# Patient Record
Sex: Female | Born: 1950 | Race: White | Hispanic: No | Smoking: Former smoker
Health system: Southern US, Community
[De-identification: ages and names within clinical notes are randomized; demographics above are authoritative.]

## PROBLEM LIST (undated history)

## (undated) DIAGNOSIS — I10 Essential (primary) hypertension: Secondary | ICD-10-CM

## (undated) DIAGNOSIS — E785 Hyperlipidemia, unspecified: Secondary | ICD-10-CM

## (undated) DIAGNOSIS — Z973 Presence of spectacles and contact lenses: Secondary | ICD-10-CM

## (undated) DIAGNOSIS — M1990 Unspecified osteoarthritis, unspecified site: Secondary | ICD-10-CM

## (undated) DIAGNOSIS — J386 Stenosis of larynx: Secondary | ICD-10-CM

## (undated) DIAGNOSIS — T4145XA Adverse effect of unspecified anesthetic, initial encounter: Secondary | ICD-10-CM

## (undated) DIAGNOSIS — K219 Gastro-esophageal reflux disease without esophagitis: Secondary | ICD-10-CM

## (undated) HISTORY — PX: TUBAL LIGATION: SHX77

## (undated) HISTORY — PX: DIRECT LARYNGOSCOPY: SHX5326

---

## 1998-03-05 ENCOUNTER — Other Ambulatory Visit: Admission: RE | Admit: 1998-03-05 | Discharge: 1998-03-05 | Payer: Self-pay | Admitting: Obstetrics and Gynecology

## 1999-03-28 ENCOUNTER — Other Ambulatory Visit: Admission: RE | Admit: 1999-03-28 | Discharge: 1999-03-28 | Payer: Self-pay | Admitting: Obstetrics and Gynecology

## 1999-03-30 ENCOUNTER — Encounter: Payer: Self-pay | Admitting: Family Medicine

## 1999-03-30 ENCOUNTER — Encounter: Admission: RE | Admit: 1999-03-30 | Discharge: 1999-03-30 | Payer: Self-pay | Admitting: General Surgery

## 2000-04-05 ENCOUNTER — Encounter: Payer: Self-pay | Admitting: Family Medicine

## 2000-04-05 ENCOUNTER — Encounter: Admission: RE | Admit: 2000-04-05 | Discharge: 2000-04-05 | Payer: Self-pay | Admitting: Family Medicine

## 2000-08-06 ENCOUNTER — Other Ambulatory Visit: Admission: RE | Admit: 2000-08-06 | Discharge: 2000-08-06 | Payer: Self-pay | Admitting: Obstetrics and Gynecology

## 2001-05-01 ENCOUNTER — Encounter: Admission: RE | Admit: 2001-05-01 | Discharge: 2001-05-01 | Payer: Self-pay | Admitting: Family Medicine

## 2001-05-01 ENCOUNTER — Encounter: Payer: Self-pay | Admitting: Family Medicine

## 2002-02-27 ENCOUNTER — Other Ambulatory Visit: Admission: RE | Admit: 2002-02-27 | Discharge: 2002-02-27 | Payer: Self-pay | Admitting: Obstetrics and Gynecology

## 2002-05-13 ENCOUNTER — Encounter: Payer: Self-pay | Admitting: Family Medicine

## 2002-05-13 ENCOUNTER — Encounter: Admission: RE | Admit: 2002-05-13 | Discharge: 2002-05-13 | Payer: Self-pay | Admitting: Family Medicine

## 2003-04-10 ENCOUNTER — Other Ambulatory Visit: Admission: RE | Admit: 2003-04-10 | Discharge: 2003-04-10 | Payer: Self-pay | Admitting: Obstetrics and Gynecology

## 2003-06-17 ENCOUNTER — Encounter: Admission: RE | Admit: 2003-06-17 | Discharge: 2003-06-17 | Payer: Self-pay | Admitting: Family Medicine

## 2003-11-16 ENCOUNTER — Encounter: Admission: RE | Admit: 2003-11-16 | Discharge: 2003-11-16 | Payer: Self-pay | Admitting: Otolaryngology

## 2004-04-06 ENCOUNTER — Ambulatory Visit (HOSPITAL_COMMUNITY): Admission: RE | Admit: 2004-04-06 | Discharge: 2004-04-07 | Payer: Self-pay | Admitting: Otolaryngology

## 2004-05-09 ENCOUNTER — Encounter: Admission: RE | Admit: 2004-05-09 | Discharge: 2004-05-09 | Payer: Self-pay | Admitting: Otolaryngology

## 2004-05-13 ENCOUNTER — Other Ambulatory Visit: Admission: RE | Admit: 2004-05-13 | Discharge: 2004-05-13 | Payer: Self-pay | Admitting: Obstetrics and Gynecology

## 2004-05-19 ENCOUNTER — Ambulatory Visit (HOSPITAL_COMMUNITY): Admission: RE | Admit: 2004-05-19 | Discharge: 2004-05-19 | Payer: Self-pay | Admitting: Otolaryngology

## 2004-11-04 ENCOUNTER — Ambulatory Visit (HOSPITAL_COMMUNITY): Admission: RE | Admit: 2004-11-04 | Discharge: 2004-11-04 | Payer: Self-pay | Admitting: Otolaryngology

## 2005-02-17 ENCOUNTER — Ambulatory Visit (HOSPITAL_COMMUNITY): Admission: RE | Admit: 2005-02-17 | Discharge: 2005-02-17 | Payer: Self-pay | Admitting: Otolaryngology

## 2005-07-11 ENCOUNTER — Other Ambulatory Visit: Admission: RE | Admit: 2005-07-11 | Discharge: 2005-07-11 | Payer: Self-pay | Admitting: Obstetrics and Gynecology

## 2005-07-18 ENCOUNTER — Encounter: Admission: RE | Admit: 2005-07-18 | Discharge: 2005-07-18 | Payer: Self-pay | Admitting: Obstetrics and Gynecology

## 2006-04-12 ENCOUNTER — Ambulatory Visit (HOSPITAL_BASED_OUTPATIENT_CLINIC_OR_DEPARTMENT_OTHER): Admission: RE | Admit: 2006-04-12 | Discharge: 2006-04-12 | Payer: Self-pay | Admitting: Otolaryngology

## 2007-10-07 ENCOUNTER — Encounter: Admission: RE | Admit: 2007-10-07 | Discharge: 2007-10-07 | Payer: Self-pay | Admitting: Family Medicine

## 2007-10-18 ENCOUNTER — Ambulatory Visit (HOSPITAL_BASED_OUTPATIENT_CLINIC_OR_DEPARTMENT_OTHER): Admission: RE | Admit: 2007-10-18 | Discharge: 2007-10-18 | Payer: Self-pay | Admitting: Otolaryngology

## 2008-12-11 ENCOUNTER — Ambulatory Visit (HOSPITAL_COMMUNITY): Admission: RE | Admit: 2008-12-11 | Discharge: 2008-12-11 | Payer: Self-pay | Admitting: Otolaryngology

## 2008-12-17 ENCOUNTER — Ambulatory Visit (HOSPITAL_BASED_OUTPATIENT_CLINIC_OR_DEPARTMENT_OTHER): Admission: RE | Admit: 2008-12-17 | Discharge: 2008-12-17 | Payer: Self-pay | Admitting: Otolaryngology

## 2009-05-20 ENCOUNTER — Encounter: Admission: RE | Admit: 2009-05-20 | Discharge: 2009-05-20 | Payer: Self-pay | Admitting: Family Medicine

## 2010-04-10 ENCOUNTER — Encounter: Payer: Self-pay | Admitting: Family Medicine

## 2010-04-18 LAB — BASIC METABOLIC PANEL
CO2: 31 mEq/L (ref 19–32)
Calcium: 9.4 mg/dL (ref 8.4–10.5)
Creatinine, Ser: 0.99 mg/dL (ref 0.4–1.2)
GFR calc Af Amer: >60 mL/min (ref >60)
Glucose, Bld: 171 mg/dL — ABNORMAL HIGH (ref 70–99)
Sodium: 139 mEq/L (ref 135–145)

## 2010-04-21 ENCOUNTER — Ambulatory Visit (HOSPITAL_BASED_OUTPATIENT_CLINIC_OR_DEPARTMENT_OTHER)
Admission: RE | Admit: 2010-04-21 | Discharge: 2010-04-21 | Disposition: A | Discharge status: Home / Self Care | Payer: PRIVATE HEALTH INSURANCE | Attending: Otolaryngology | Admitting: Otolaryngology

## 2010-04-21 DIAGNOSIS — J386 Stenosis of larynx: Secondary | ICD-10-CM | POA: Insufficient documentation

## 2010-04-21 DIAGNOSIS — I10 Essential (primary) hypertension: Secondary | ICD-10-CM | POA: Insufficient documentation

## 2010-04-21 DIAGNOSIS — E119 Type 2 diabetes mellitus without complications: Secondary | ICD-10-CM | POA: Insufficient documentation

## 2010-04-21 LAB — GLUCOSE, CAPILLARY: Glucose-Capillary: 153 mg/dL — ABNORMAL HIGH (ref 70–99)

## 2010-05-02 NOTE — Op Note (Signed)
  Victoria Mccormick, Victoria Mccormick             ACCOUNT NO.:  [id number]  MEDICAL RECORD NO.:  [id number]           PATIENT TYPE:  LOCATION:                                 FACILITY:  PHYSICIAN:  Feven Alderfer H. Pollyann Kennedy, MD     DATE OF BIRTH:  03-15-1951  DATE OF PROCEDURE:  04/21/2010 DATE OF DISCHARGE:                              OPERATIVE REPORT   PREOPERATIVE DIAGNOSIS:  Idiopathic subglottic stenosis.  POSTOPERATIVE DIAGNOSIS:  Idiopathic subglottic stenosis.  PROCEDURE:  Microlaryngoscopy with laser treatment of subglottic stenosis and dilatation with balloon dilator up to 16 mm.  No complications.  BLOOD LOSS:  Minimal.  FINDINGS:  Circumferential subglottic stenosis about 1 to 1.5 cm below the glottis with a thin rim of stenosis.  There was no mass or granulation tissue seen.  HISTORY:  A 60 year old lady with idiopathic subglottic stenosis.  Her last treatment was about almost a year and a half ago.  She started having some respiratory obstruction in the past month or two.  Risks, benefits, alternatives, complications of the procedure were explained to the patient who seemed to understand and agreed to surgery.  PROCEDURE:  The patient was taken to the operating room, placed on the operating table in supine position.  Following induction of general endotracheal anesthesia using a #5 endotracheal tube which was passed with some resistance, the table was turned and the patient was draped in the standard fashion including wet eye pads and wet towels around the face.  The anterior commissure laryngoscope was used to visualize the larynx.  The larger laryngoscopes would not provide adequate exposure. A maxillary tooth protector was used.  The scope was suspended to the Mayo stand with suspension apparatus.  The microscope was brought into view.  The endotracheal tube was removed and was easily replaced through the laryngoscope as needed.  The subglottic stenotic area was examined and was  treated with radial cuts using the CO2 laser at 2 W continuous power.  The is stenosis on the right side was minimal and the cuts were all done from 12 o'clock down to 6 o'clock along the left side.  The endotracheal tube was replaced periodically to keep the saturations up in the 90s.  The balloon dilator was then used.  The 16-mm dilator was used on two occasions, each held for 30 seconds.  Following that, the patient was reintubated with a 7 endotracheal tube which passed easily through the subglottic region.  She was then awakened from anesthesia, extubated, and transferred to recovery in stable condition.     Lanie Schelling H. Pollyann Kennedy, MD     JHR/MEDQ  D:  04/21/2010  T:  04/21/2010  Job:  [id number]  Electronically Signed by Serena Colonel MD on 05/02/2010 09:36:01 PM

## 2010-06-09 ENCOUNTER — Other Ambulatory Visit: Payer: Self-pay | Admitting: Family Medicine

## 2010-06-09 DIAGNOSIS — Z1231 Encounter for screening mammogram for malignant neoplasm of breast: Secondary | ICD-10-CM

## 2010-06-17 ENCOUNTER — Ambulatory Visit
Admission: RE | Admit: 2010-06-17 | Discharge: 2010-06-17 | Disposition: A | Discharge status: Home / Self Care | Payer: PRIVATE HEALTH INSURANCE | Source: Ambulatory Visit | Attending: Family Medicine | Admitting: Family Medicine

## 2010-06-17 DIAGNOSIS — Z1231 Encounter for screening mammogram for malignant neoplasm of breast: Secondary | ICD-10-CM

## 2010-06-24 LAB — CBC
HCT: 39 % (ref 36.0–46.0)
MCV: 86.9 fL (ref 78.0–100.0)
RBC: 4.49 MIL/uL (ref 3.87–5.11)
RDW: 12.6 % (ref 11.5–15.5)
WBC: 6.4 10*3/uL (ref 4.0–10.5)

## 2010-06-24 LAB — BASIC METABOLIC PANEL
CO2: 30 mEq/L (ref 19–32)
Calcium: 9.4 mg/dL (ref 8.4–10.5)
GFR calc non Af Amer: >60 mL/min (ref >60)
Glucose, Bld: 149 mg/dL — ABNORMAL HIGH (ref 70–99)

## 2010-06-24 LAB — GLUCOSE, CAPILLARY: Glucose-Capillary: 149 mg/dL — ABNORMAL HIGH (ref 70–99)

## 2010-08-02 NOTE — Op Note (Signed)
NAMEGINNIFER, Victoria Mccormick             ACCOUNT NO.:  [id number]   MEDICAL RECORD NO.:  [id number]          PATIENT TYPE:  AMB   LOCATION:  DSC                          FACILITY:  [facility]   PHYSICIAN:  Jefry H. Pollyann Kennedy, MD     DATE OF BIRTH:  [birth year]   DATE OF PROCEDURE:  10/18/2007  DATE OF DISCHARGE:                               OPERATIVE REPORT   PREOPERATIVE DIAGNOSIS:  Subglottic stenosis.   POSTOPERATIVE DIAGNOSIS:  Subglottic stenosis.   PROCEDURE:  Laser laryngoscopy and dilation of subglottic stenosis.   ANESTHESIA:  General endotracheal anesthesia was used.   COMPLICATIONS:  No complications.   ESTIMATED BLOOD LOSS:  Minimal.   FINDINGS:  Subglottic stenosis, posteriorly-based.  Initially, narrowed  down to 5.5 endotracheal tube size.  At the termination of the  procedure, just short of an 8 endotracheal tube size.   HISTORY:  A 60 year old with idiopathic subglottic stenosis has required  multiple laser laryngoscopies and dilations over the past several years.  The last one was almost 2 years ago.  She has started having inspiratory  stridor again.  Risks, benefits, alternatives, and complications of the  procedure were explained to the patient, seemed understanding, and  agreed to surgery.   PROCEDURE:  The patient was taken to the operating room, placed on the  operating table in supine position.  Following induction of general  endotracheal anesthesia using a 5.5 endotracheal tube, the table was  turned and the patient was draped in standard fashion.  Saline-soaked  towels were used around the face and the eyes.  A Jako-type laryngoscope  was entered into the oral cavity and used to visualize the larynx.  This  was attached to the Mayo stand with suspension apparatus.  Microscope  was brought into view.  Maxillary tooth protector was used throughout  the case.  The endotracheal tube was removed and the larynx and  subglottic area were inspected.  The above-mentioned  findings were  noted.  Using apneic technique, the CO2 laser with 2 watts continuous  power was used to create radial cuts along the subglottic narrowed area  from about 3 o'clock over to 9 o'clock going inferiorly.  The  endotracheal tube was intermittently replaced to allow for ventilation  and to keep the saturations above 95%.  After several cuts were made  with a laser, the [facility] laryngeal dilators were used up to number 34.  I was not able to fit the 38 through the laryngoscope and I was not able  to get it in with correct angle using a anesthesia laryngoscope.  The  patient was then reintubated with a #7 tube and then attempts were made  to reintubate with an 8, I was able to get it  through the cords and up to the stenotic segment, but not quite able to  get it through, was just slight mid to large diameter.  The 7 tube was  placed and the patient was then allowed to awaken from anesthesia, was  extubated, and transferred to recovery in stable condition.      Jefry H. Pollyann Kennedy, MD  Electronically  Signed     JHR/MEDQ  D:  10/18/2007  T:  10/19/2007  Job:  [id number]

## 2010-08-05 NOTE — Op Note (Signed)
Victoria Mccormick, Victoria Mccormick             ACCOUNT NO.:  [id number]   MEDICAL RECORD NO.:  [id number]          PATIENT TYPE:  OIB   LOCATION:  2870                         FACILITY:  [facility]   PHYSICIAN:  Jefry H. Pollyann Kennedy, MD     DATE OF BIRTH:  03/14/1951   DATE OF PROCEDURE:  04/06/2004  DATE OF DISCHARGE:                                 OPERATIVE REPORT   PREOPERATIVE DIAGNOSIS:  Subglottic stenosis.   POSTOPERATIVE DIAGNOSIS:  Subglottic stenosis.   PROCEDURE:  Direct laryngoscopy with bronchoscopy and laser treatment and  dilation of subglottic stenosis.   SURGEON:  Jefrey H. Pollyann Kennedy, MD   ANESTHESIA:  General endotracheal.   COMPLICATIONS:  None.   BLOOD LOSS:  Minimal.   FINDINGS:  Significant subglottic stenosis, less than 1 cm in length,  apparently arising from the anterior wall of the subglottic larynx.  At the  beginning of the procedure, a #4 pediatric endotracheal tube was very  difficult to pass through to the larynx.  After termination, a #6 adult tube  was relatively easily passed through.  The larynx was also dilated up to a  #32 with [facility] dilators.   HISTORY:  This is a 60 year old lady with an 8-10 month history of  progressively worsening dyspnea and inspiratory stridor with exercise  intolerance.  Risks, benefits, alternatives, and complications of the  procedure are explained to the patient, who seemed to understand and agreed  to surgery.   PROCEDURE:  Patient was taken to the operating room and placed on the  operating room table in the supine position.  Following induction of general  endotracheal anesthesia, the airway was evaluated using a combination of a  Jako laryngoscope, anterior commissure laryngoscope, and a rigid  bronchoscope.  Initial attempts at intubation were difficult, but a #4  pediatric tube was passed with minimal difficulty.  The oxygen saturation  levels were within normal range the entire duration of the procedure.  The  larynx was  dilated using Jackson dilators up to a 32 with some difficulty.  Using an anterior commissure scope, the CO2 laser was attached to the  microscope and used at a setting of 3.5 watts with super pulse to laser off  the anterior shelf in a radial fashion.  Further dilations were then  accomplished using various sized endotracheal tubes up to the #6 adult tube.  The bronchoscope was also used to  dilate intermittently during the procedure and also to ventilate and examine  the more distal trachea, which was all normal to inspection.  At the  termination of the procedure, the patient was then awakened, extubated, and  transferred to recovery in stable condition.      JHR/MEDQ  D:  04/06/2004  T:  04/06/2004  Job:  [id number]

## 2010-08-05 NOTE — Op Note (Signed)
NAMEBRE, PECINA             ACCOUNT NO.:  [id number]   MEDICAL RECORD NO.:  [id number]          PATIENT TYPE:  AMB   LOCATION:  SDS                          FACILITY:  [facility]   PHYSICIAN:  Jefry H. Pollyann Kennedy, MD     DATE OF BIRTH:  [birth year]   DATE OF PROCEDURE:  02/17/2005  DATE OF DISCHARGE:  02/17/2005                                 OPERATIVE REPORT   PREOPERATIVE DIAGNOSIS:  Subglottic stenosis with airway obstruction.   POSTOPERATIVE DIAGNOSIS:  Subglottic stenosis with airway obstruction.   PROCEDURE:  Microlaryngoscopy with laser excision of subglottic stenosis and  dilatation of the subglottic larynx up to 38 [place].   SURGEON:  Jefry H. Pollyann Kennedy, MD   ANESTHESIA:  General endotracheal anesthesia was used.   COMPLICATIONS:  None.   BLOOD LOSS:  Minimal.   FINDINGS:  Circumferential but somewhat asymmetric, more so anterior  subglottic stenosis.  At the beginning of the procedure, the patient was  intubated with a 4.5 endotracheal tube and at the termination was intubated  with a #8 endotracheal tube.   HISTORY:  This is a 60 year old lady with idiopathic subglottic stenosis.  It has been about nine months since her last procedure.  The risks,  benefits, alternatives and complications of the procedure were explained to  the patient who seemed to understand and agreed to surgery.   PROCEDURE:  The patient was taken to the operating room and placed on the  operating table in the supine position.  Following induction of general  endotracheal anesthesia, the table was turned.  The patient was draped in  the standard fashion.  A maxillary tooth protector was used for the  procedure.  A Jako laryngoscope was used to examine the larynx and attached  to the Mayo stand with the suspension apparatus.  The microscope was brought  into view.  The endotracheal tube was removed and using apnea technique, the  subglottic airway was inspected.  The CO2 laser attached to the  microscope  was used with a two watt power and continuous beam, on the very small focus  beam, and was used to make radial cuts through the stenosis in all  directions.  Apnea technique was used with reintubation using serially  larger tubes each time in between sessions with the laser.   At the termination of the lasering, the larynx was then dilated with [facility]  dilators up to 38 [place].  The laryngoscope was removed and a #8  endotracheal tube was then inserted with minimal difficulty.  The patient  was then allowed to awaken from anesthesia and was extubated without  difficulty and transferred to recovery in stable condition.      Jefry H. Pollyann Kennedy, MD  Electronically Signed     JHR/MEDQ  D:  02/17/2005  T:  02/18/2005  Job:  [id number]   cc:   Bryan Lemma. Manus Gunning, M.D.  Fax: [phone]

## 2010-08-05 NOTE — Op Note (Signed)
[name], Victoria Mccormick             ACCOUNT NO.:  [id number]   MEDICAL RECORD NO.:  [id number]          PATIENT TYPE:  AMB   LOCATION:  SDS                          FACILITY:  [facility]   PHYSICIAN:  Jefry H. 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   POSTOPERATIVE DIAGNOSIS:  Idiopathic subglottic stenosis presumably  secondary to occult laryngopharyngeal reflux disease.   PROCEDURE:  Microlaryngoscopy with laser excision of subglottic stenosis and  dilatation.   NOTE:  This is the third in a series of procedures.  The patient was  initially intubated with a #5.5 endotracheal tube, was dilated up to 38-  [place], and then a 7.5 endotracheal tube was placed at the end of the  procedure.   REFERRING PHYSICIAN:  Bryan Lemma. Manus Gunning, M.D.   HISTORY:  A 60 year old lady with a history of progressive dyspnea on  exertion and inspiratory stridor, was found to have subglottic stenosis,  idiopathic in nature, and has undergone two procedures, the first in  January, the second in March of this year.  She was starting to have  recurrent stridor with moderate exertional dyspnea.  Risks, benefits,  alternatives, complications of the procedure were explained to the patient,  who seemed to understand and agreed to surgery.   PROCEDURE:  The patient was taken to the operating room and placed on the  operating table in supine position.  Following induction of general endotracheal anesthesia, the table was turned  and the patient was draped in the standard fashion.  An anterior commissure  laser laryngoscope was inserted into the oral cavity with a maxillary tooth  protector in place.  The laryngoscope was attached to the suspension  apparatus on the Mayo stand.  The microscope  was brought into view and the  larynx was examined.  The supraglottic and glottic structures were all  unremarkable.  The subglottic stenosis was visualized in a circumferential  fashion, seemed to be preponderantly on the posterior and right side.  The  CO2 laser was used on a setting of 5 watts continuous, and radial cuts were  made through the stenotic area in all directions.  A total of approximately  eight cuts were made.  The laser and dilatation was done under apneic  technique, removing the endotracheal tube and replacing in between  treatments.  The  stenosis was then dilated with [facility] dilators up to 38-French.  Following  this, #7.5 endotracheal tube was placed without difficulty and left in place  while anesthesia and manage the awakening and emergence from anesthesia.  The patient was then awakened, extubated and transferred to recovery in  stable condition.      Jefry H. Pollyann Kennedy, MD  Electronically Signed     JHR/MEDQ  D:  11/04/2004  T:  11/04/2004  Job:  [id number]   cc:   Bryan Lemma. Manus Gunning, M.D.  [address]  [place] [address]  Fax: [phone]

## 2010-08-05 NOTE — Op Note (Signed)
Victoria Mccormick, Victoria Mccormick             ACCOUNT NO.:  [id number]   MEDICAL RECORD NO.:  [id number]          PATIENT TYPE:  AMB   LOCATION:  DSC                          FACILITY:  [facility]   PHYSICIAN:  Jefry H. Pollyann Kennedy, MD     DATE OF BIRTH:  1951/02/22   DATE OF PROCEDURE:  04/12/2006  DATE OF DISCHARGE:  04/12/2006                               OPERATIVE REPORT   PREOPERATIVE DIAGNOSIS:  Subglottic stenosis.   POSTOPERATIVE DIAGNOSIS:  Subglottic stenosis.   PROCEDURE:  Direct microlaryngoscopy with laser excision of subglottic  stenosis and dilation the stenotic region.   The lesion was dilated with [facility] dilators up to 38-French and the  patient was initially intubated with a 5.5 endotracheal tube.  At the  termination of the procedure, a #8 tube was attempted, but could not  enter completely, although we were able to ventilate through it and a #7  tube was then used at the termination of the procedure, which fit in  fairly easily.  A 7.5 tube was not available for use at this particular  time.   SURGEON:  Jefry H. Pollyann Kennedy, MD   COMPLICATIONS:  No complications.   BLOOD LOSS:  No blood loss.   HISTORY:  A 60 year old with a history of idiopathic subglottic stenosis  who started having exercise intolerance and worsening inspiratory  stridor again.  Risks, benefits, alternatives and complications to the  procedure were explained to the patient, who seemed to understand and  agreed to surgery.   PROCEDURE:  The patient was taken to the operating room and placed on  the operating table in a supine position.  Following induction of  general endotracheal anesthesia with a 5.5 endotracheal tube, the table  was turned and the patient was draped for laser laryngoscopy.  Wet  towels were used around the face.  A Jako-type anterior commissure scope  was used to visualize the larynx.  This was attached to the Mayo stand  with the suspension apparatus.  Once the airway was nicely  visualized,  the endotracheal tube was removed and then replaced through the  laryngoscope.  Tooth protectors were used on upper and lower dentition  throughout the procedure.  The laser work was done using apneic  technique.  The tube was removed and the CO2 laser on a setting of 2  watts continuous power was used to make radial cuts through the  subglottic stenotic region, which was a thin shelf-like circumferential  ring of tissue.  It was concentrated more in the anterior and lateral  and very little in the posterior.  The ventilation tube was replaced  periodically to keep the saturations above 90%.  The patient did  desaturate fairly quickly when the tube was removed, so several sessions  were required in order to keep the saturations up to a safe level.  After the laser cuts were made, the [facility] dilators were used up to 29-  [place].  Following that, the patient was then reintubated through the  laryngoscope and saturations were brought up again to the high 90s.  The  laryngoscope was then removed and  then using an anesthesia laryngoscope,  the patient was reintubated, initially attempted with a #8, which  was not able to pass completely through the subglottis, but we were able  to ventilate easily through that.  A #7 tube was then replaced.  The  patient was then awakened from anesthesia, extubated and transferred to  Recovery in stable condition.      Jefry H. Pollyann Kennedy, MD  Electronically Signed     JHR/MEDQ  D:  04/17/2006  T:  04/17/2006  Job:  [id number]

## 2010-08-05 NOTE — Op Note (Signed)
NAMEJENYA, Victoria Mccormick             ACCOUNT NO.:  [id number]   MEDICAL RECORD NO.:  [id number]          PATIENT TYPE:  OIB   LOCATION:  2857                         FACILITY:  [facility]   PHYSICIAN:  Jefry H. Pollyann Kennedy, MD     DATE OF BIRTH:  02/10/1951   DATE OF PROCEDURE:  05/19/2004  DATE OF DISCHARGE:                                 OPERATIVE REPORT   PREOPERATIVE DIAGNOSIS:  Subglottic stenosis with stridor.   POSTOPERATIVE DIAGNOSIS:  Subglottic stenosis with stridor.   PROCEDURE:  Laser laryngoscopy with dilation of subglottic stenosis up to 42  [place].   SURGEON:  Jefry H. Pollyann Kennedy, M.D.   General endotracheal anesthesia was used.  No complications.   FINDINGS:  Subglottic stenosis, significantly improved compared to the  preoperative state two months ago.  This was dilated up to 42 [place], and  7.5 endotracheal tube was inserted at the termination of the procedure.  Jet ventilation was used during the procedure.  Apneic technique was used  during the dilation and the laser.   HISTORY:  This is a 60 year old lady with a history of idiopathic subglottic  stenosis presumably secondary to chronic reflux disease.  Risks, benefits  alternatives and complications of the procedure were explained to the  patient, who seemed to understand and agreed to surgery.   PROCEDURE:  The patient was taken to the operating room and placed on the  operating table in supine position.  Following induction of general anesthesia, the table was turned and the  patient was draped for laser laryngoscopy.  A maxillary tooth protector was  used.  A Jako laryngoscope was used to view the laryngeal structures.  Jet  ventilation was used on an intermittent basis to maintain oxygen saturation.  The suspension apparatus was used to secure the laryngoscope in place.  The  microscope was brought into view.  The subglottic stenosis was  circumferential.  Radial laser cuts were created using 3 watts continuous  power  in six or seven different positions.  The stenotic segment was then  dilated with [facility] dilators up to 42 [place].  Following this, a number 7.5  endotracheal tube was inserted with some difficulty due to the stenosis.  The patient was then allowed to awaken from anesthesia and was extubated,  transferred to recovery in stable condition.      JHR/MEDQ  D:  05/19/2004  T:  05/19/2004  Job:  [id number]

## 2010-12-16 LAB — POCT HEMOGLOBIN-HEMACUE: Hemoglobin: 14.4

## 2010-12-16 LAB — BASIC METABOLIC PANEL
BUN: 19
Calcium: 9.6
Chloride: 101
Creatinine, Ser: 0.78
GFR calc non Af Amer: >60

## 2011-05-04 ENCOUNTER — Ambulatory Visit: Admit: 2011-05-04 | Payer: Self-pay | Admitting: Otolaryngology

## 2011-05-04 SURGERY — MICROLARYNGOSCOPY
Anesthesia: General

## 2011-05-10 ENCOUNTER — Encounter (HOSPITAL_BASED_OUTPATIENT_CLINIC_OR_DEPARTMENT_OTHER): Payer: Self-pay | Admitting: *Deleted

## 2011-05-10 NOTE — Progress Notes (Signed)
To come in for ekg and bmet Comes yearly for dilations

## 2011-05-10 NOTE — Progress Notes (Signed)
Waved ekg per dr fredereck

## 2011-05-10 NOTE — H&P (Signed)
  Victoria Mccormick is an 61 y.o. female.   Chief Complaint: Shortness of breath HPI: History of chronic, idiopathic subglottic stenosis. Status/post multiple laser treatments and dilations.  No past medical history on file.  No past surgical history on file.  No family history on file. Social History:  does not have a smoking history on file. She does not have any smokeless tobacco history on file. Her alcohol and drug histories not on file.  Allergies: No Known Allergies  No current facility-administered medications on file as of .   No current outpatient prescriptions on file as of .    No results found for this or any previous visit (from the past 48 hour(s)). No results found.  ROS: otherwise negative  There were no vitals taken for this visit.  PHYSICAL EXAM: Overall appearance:  Healthy appearing, in no distress. She has audible inspiratory stridor. Head:  Normocephalic, atraumatic. Ears: External auditory canals are clear; tympanic membranes are intact and the middle ears are free of any effusion. Nose: External nose is healthy in appearance. Internal nasal exam free of any lesions or obstruction. Oral Cavity:  There are no mucosal lesions or masses identified. Oral Pharynx/Hypopharynx/Larynx: no signs of any mucosal lesions or masses identified. Vocal cords move normally. Subglottic airway narrowing. Neuro:  No identifiable neurologic deficits. Neck: No palpable neck masses.  Studies Reviewed: none    Assessment/Plan Idiopathic subglottic stenosis. Repeat laser laryngoscopy with balloon dilation.  Leahmarie Gasiorowski H 05/10/2011, 8:54 AM

## 2011-05-15 ENCOUNTER — Encounter (HOSPITAL_BASED_OUTPATIENT_CLINIC_OR_DEPARTMENT_OTHER)
Admission: RE | Admit: 2011-05-15 | Discharge: 2011-05-15 | Disposition: A | Discharge status: Home / Self Care | Payer: PRIVATE HEALTH INSURANCE | Source: Ambulatory Visit | Attending: Otolaryngology | Admitting: Otolaryngology

## 2011-05-15 LAB — BASIC METABOLIC PANEL
CO2: 31 mEq/L (ref 19–32)
Chloride: 103 mEq/L (ref 96–112)
GFR calc Af Amer: 88 mL/min — ABNORMAL LOW (ref >90)
Potassium: 4.4 mEq/L (ref 3.5–5.1)
Sodium: 143 mEq/L (ref 135–145)

## 2011-05-15 NOTE — Progress Notes (Signed)
Pt informed to bring all medications the day of surgery.  Pt verbalized understanding.

## 2011-05-18 ENCOUNTER — Encounter (HOSPITAL_BASED_OUTPATIENT_CLINIC_OR_DEPARTMENT_OTHER): Payer: Self-pay | Admitting: Anesthesiology

## 2011-05-18 ENCOUNTER — Encounter (HOSPITAL_BASED_OUTPATIENT_CLINIC_OR_DEPARTMENT_OTHER)
Admission: RE | Disposition: A | Discharge status: Home / Self Care | Payer: Self-pay | Source: Ambulatory Visit | Attending: Otolaryngology

## 2011-05-18 ENCOUNTER — Ambulatory Visit (HOSPITAL_BASED_OUTPATIENT_CLINIC_OR_DEPARTMENT_OTHER): Payer: PRIVATE HEALTH INSURANCE | Admitting: Anesthesiology

## 2011-05-18 ENCOUNTER — Encounter (HOSPITAL_BASED_OUTPATIENT_CLINIC_OR_DEPARTMENT_OTHER): Payer: Self-pay | Admitting: *Deleted

## 2011-05-18 ENCOUNTER — Ambulatory Visit (HOSPITAL_BASED_OUTPATIENT_CLINIC_OR_DEPARTMENT_OTHER)
Admission: RE | Admit: 2011-05-18 | Discharge: 2011-05-18 | Disposition: A | Discharge status: Home / Self Care | Payer: PRIVATE HEALTH INSURANCE | Source: Ambulatory Visit | Attending: Otolaryngology | Admitting: Otolaryngology

## 2011-05-18 DIAGNOSIS — E119 Type 2 diabetes mellitus without complications: Secondary | ICD-10-CM | POA: Insufficient documentation

## 2011-05-18 DIAGNOSIS — Z01812 Encounter for preprocedural laboratory examination: Secondary | ICD-10-CM | POA: Insufficient documentation

## 2011-05-18 DIAGNOSIS — J386 Stenosis of larynx: Secondary | ICD-10-CM | POA: Insufficient documentation

## 2011-05-18 DIAGNOSIS — I10 Essential (primary) hypertension: Secondary | ICD-10-CM | POA: Insufficient documentation

## 2011-05-18 DIAGNOSIS — K219 Gastro-esophageal reflux disease without esophagitis: Secondary | ICD-10-CM | POA: Insufficient documentation

## 2011-05-18 HISTORY — DX: Hyperlipidemia, unspecified: E78.5

## 2011-05-18 HISTORY — DX: Essential (primary) hypertension: I10

## 2011-05-18 HISTORY — DX: Gastro-esophageal reflux disease without esophagitis: K21.9

## 2011-05-18 LAB — GLUCOSE, CAPILLARY: Glucose-Capillary: 145 mg/dL — ABNORMAL HIGH (ref 70–99)

## 2011-05-18 LAB — POCT HEMOGLOBIN-HEMACUE: Hemoglobin: 12.5 g/dL (ref 12.0–15.0)

## 2011-05-18 SURGERY — MICROLARYNGOSCOPY WITH LASER AND BALLOON DILATION
Anesthesia: General | Site: Throat | Wound class: Clean Contaminated

## 2011-05-18 MED ORDER — MIDAZOLAM HCL 5 MG/5ML IJ SOLN
INTRAMUSCULAR | Status: DC | PRN
  Administered 2011-05-18: 0.5 mg via INTRAVENOUS

## 2011-05-18 MED ORDER — ONDANSETRON HCL 4 MG/2ML IJ SOLN
INTRAMUSCULAR | Status: DC | PRN
  Administered 2011-05-18: 4 mg via INTRAVENOUS

## 2011-05-18 MED ORDER — AMOXICILLIN 500 MG PO CAPS: 500.0000 mg | ORAL_CAPSULE | Freq: Three times a day (TID) | ORAL | Status: AC

## 2011-05-18 MED ORDER — IBUPROFEN 400 MG PO TABS
400.0000 mg | ORAL_TABLET | Freq: Three times a day (TID) | ORAL | Status: DC
  Administered 2011-05-18: 400 mg via ORAL

## 2011-05-18 MED ORDER — METOCLOPRAMIDE HCL 5 MG/ML IJ SOLN: 10.0000 mg | Freq: Once | INTRAMUSCULAR | Status: DC | PRN

## 2011-05-18 MED ORDER — SUCCINYLCHOLINE CHLORIDE 20 MG/ML IJ SOLN
INTRAMUSCULAR | Status: DC | PRN
  Administered 2011-05-18: 100 mg via INTRAVENOUS

## 2011-05-18 MED ORDER — FENTANYL CITRATE 0.05 MG/ML IJ SOLN: 25.0000 ug | INTRAMUSCULAR | Status: DC | PRN

## 2011-05-18 MED ORDER — PROPOFOL 10 MG/ML IV EMUL
INTRAVENOUS | Status: DC | PRN
  Administered 2011-05-18: 200 mg via INTRAVENOUS

## 2011-05-18 MED ORDER — MORPHINE SULFATE 4 MG/ML IJ SOLN: 0.0500 mg/kg | INTRAMUSCULAR | Status: DC | PRN

## 2011-05-18 MED ORDER — DROPERIDOL 2.5 MG/ML IJ SOLN
INTRAMUSCULAR | Status: DC | PRN
  Administered 2011-05-18: 0.625 mg via INTRAVENOUS

## 2011-05-18 MED ORDER — DEXAMETHASONE SODIUM PHOSPHATE 4 MG/ML IJ SOLN
INTRAMUSCULAR | Status: DC | PRN
  Administered 2011-05-18: 4 mg via INTRAVENOUS

## 2011-05-18 MED ORDER — LACTATED RINGERS IV SOLN
INTRAVENOUS | Status: DC
  Administered 2011-05-18: 07:00:00 via INTRAVENOUS

## 2011-05-18 MED ORDER — LIDOCAINE HCL (CARDIAC) 20 MG/ML IV SOLN
INTRAVENOUS | Status: DC | PRN
  Administered 2011-05-18: 50 mg via INTRAVENOUS

## 2011-05-18 SURGICAL SUPPLY — 31 items
CANISTER SUCTION 1200CC (MISCELLANEOUS) ×2 IMPLANT
CATH BALLOON 10X40MM (CATHETERS) IMPLANT
CLOTH BEACON ORANGE TIMEOUT ST (SAFETY) ×2 IMPLANT
DEPRESSOR TONGUE BLADE STERILE (MISCELLANEOUS) ×2 IMPLANT
DEVICE INFLATION 20/61 (MISCELLANEOUS) ×1 IMPLANT
FILTER 7/8 IN (FILTER) ×1 IMPLANT
GAUZE SPONGE 4X4 12PLY STRL LF (GAUZE/BANDAGES/DRESSINGS) ×4 IMPLANT
GLOVE ECLIPSE 7.5 STRL STRAW (GLOVE) ×2 IMPLANT
GLOVE SKINSENSE NS SZ7.0 (GLOVE) ×1
GLOVE SKINSENSE STRL SZ7.0 (GLOVE) IMPLANT
GOWN PREVENTION PLUS XLARGE (GOWN DISPOSABLE) ×2 IMPLANT
GOWN PREVENTION PLUS XXLARGE (GOWN DISPOSABLE) IMPLANT
GUARD TEETH (MISCELLANEOUS) ×2 IMPLANT
MARKER SKIN DUAL TIP RULER LAB (MISCELLANEOUS) IMPLANT
NS IRRIG 1000ML POUR BTL (IV SOLUTION) ×2 IMPLANT
PAD EYE OVAL STERILE LF (GAUZE/BANDAGES/DRESSINGS) ×4 IMPLANT
PATTIES SURGICAL .5 X3 (DISPOSABLE) ×1 IMPLANT
REDUCTION FITTING 1/4 IN (FILTER) IMPLANT
SHEET MEDIUM DRAPE 40X70 STRL (DRAPES) ×2 IMPLANT
SLEEVE SCD COMPRESS KNEE MED (MISCELLANEOUS) IMPLANT
SOLUTION BUTLER CLEAR DIP (MISCELLANEOUS) IMPLANT
SURGILUBE 2OZ TUBE FLIPTOP (MISCELLANEOUS) IMPLANT
SYR 5ML LL (SYRINGE) IMPLANT
SYR CONTROL 10ML LL (SYRINGE) IMPLANT
SYR TB 1ML LL NO SAFETY (SYRINGE) IMPLANT
SYSTEM BALLN DILATION 12X40 (BALLOONS) IMPLANT
SYSTEM BALLN DILATION 14X40 (BALLOONS) ×1 IMPLANT
SYSTEM BALLN DILATION 16X40 (BALLOONS) IMPLANT
TOWEL OR 17X24 6PK STRL BLUE (TOWEL DISPOSABLE) ×2 IMPLANT
TUBE CONNECTING 20X1/4 (TUBING) ×3 IMPLANT
WATER STERILE IRR 1000ML POUR (IV SOLUTION) ×1 IMPLANT

## 2011-05-18 NOTE — Anesthesia Postprocedure Evaluation (Signed)
Anesthesia Post Note  Patient: Victoria Mccormick  Procedure(s) Performed: Procedure(s) (LRB): MICROLARYNGOSCOPY WITH LASER AND BALLOON DILATION (N/A)  Anesthesia type: General  Patient location: PACU  Post pain: Pain level controlled  Post assessment: Patient's Cardiovascular Status Stable  Last Vitals:  Filed Vitals:   05/18/11 0900  BP: 151/70  Pulse: 61  Temp:   Resp: 12    Post vital signs: Reviewed and stable  Level of consciousness: alert  Complications: No apparent anesthesia complications

## 2011-05-18 NOTE — Transfer of Care (Signed)
Immediate Anesthesia Transfer of Care Note  Patient: Victoria Mccormick  Procedure(s) Performed: Procedure(s) (LRB): MICROLARYNGOSCOPY WITH LASER AND BALLOON DILATION (N/A)  Patient Location: PACU  Anesthesia Type: General  Level of Consciousness: awake, alert  and oriented  Airway & Oxygen Therapy: Patient Spontanous Breathing and Patient connected to face mask oxygen  Post-op Assessment: Report given to PACU RN and Post -op Vital signs reviewed and stable  Post vital signs: Reviewed and stable  Complications: No apparent anesthesia complications

## 2011-05-18 NOTE — Interval H&P Note (Signed)
History and Physical Interval Note:  05/18/2011 7:19 AM  Victoria Mccormick  has presented today for surgery, with the diagnosis of subglottic stenosis  The various methods of treatment have been discussed with the patient and family. After consideration of risks, benefits and other options for treatment, the patient has consented to  Procedure(s) (LRB): MICROLARYNGOSCOPY WITH LASER AND BALLOON DILATION (N/A) as a surgical intervention .  The patients' history has been reviewed, patient examined, no change in status, stable for surgery.  I have reviewed the patients' chart and labs.  Questions were answered to the patient's satisfaction.     [name]

## 2011-05-18 NOTE — Anesthesia Procedure Notes (Signed)
Procedure Name: Intubation Date/Time: 05/18/2011 7:44 AM Performed by: Zenia Resides D Pre-anesthesia Checklist: Patient identified, Emergency Drugs available, Suction available, Patient being monitored and Timeout performed Patient Re-evaluated:Patient Re-evaluated prior to inductionOxygen Delivery Method: Circle System Utilized Preoxygenation: Pre-oxygenation with 100% oxygen Intubation Type: IV induction Ventilation: Mask ventilation without difficulty Laryngoscope Size: Mac and 3 Grade View: Grade II Tube type: Oral Tube size: 5.0 mm Number of attempts: 1 Airway Equipment and Method: stylet and oral airway Placement Confirmation: ETT inserted through vocal cords under direct vision,  positive ETCO2 and breath sounds checked- equal and bilateral Secured at: 23 cm Tube secured with: Tape Dental Injury: Teeth and Oropharynx as per pre-operative assessment

## 2011-05-18 NOTE — Discharge Instructions (Signed)
Resume diet as normal. Take antibiotic as directed by Script         .[facility]  [address] [place], [place] [address] [phone]   Post Anesthesia Home Care Instructions  Activity: Get plenty of rest for the remainder of the day. A responsible adult should stay with you for 24 hours following the procedure.  For the next 24 hours, DO NOT: -Drive a car -Advertising copywriter -Drink alcoholic beverages -Take any medication unless instructed by your physician -Make any legal decisions or sign important papers.  Meals: Start with liquid foods such as gelatin or soup. Progress to regular foods as tolerated. Avoid greasy, spicy, heavy foods. If nausea and/or vomiting occur, drink only clear liquids until the nausea and/or vomiting subsides. Call your physician if vomiting continues.  Special Instructions/Symptoms: Your throat may feel dry or sore from the anesthesia or the breathing tube placed in your throat during surgery. If this causes discomfort, gargle with warm salt water. The discomfort should disappear within 24 hours.

## 2011-05-18 NOTE — Op Note (Signed)
OPERATIVE REPORT  DATE OF SURGERY: 05/18/2011  PATIENT:  Victoria Mccormick,  61 y.o. female  PRE-OPERATIVE DIAGNOSIS:  subglottic stenosis  POST-OPERATIVE DIAGNOSIS:  subglottic stenosis  PROCEDURE:  Procedure(s): MICROLARYNGOSCOPY WITH LASER AND BALLOON DILATION  SURGEON:  Susy Frizzle, MD  ASSISTANTS: none   ANESTHESIA:   general  EBL:  10 ml  DRAINS: none   LOCAL MEDICATIONS USED:  NONE  SPECIMEN:  No Specimen  COUNTS:  YES  PROCEDURE DETAILS: Patient was taken to the operating room and placed on the operating table in the supine position. Following induction of general endotracheal anesthesia, the patient was orally intubated with a #5 tube. There were some resistance felt that it went in easily. The table was then turned 90. Draping was applied around the head and face. Saline soaked eye pads and saline soaked towels were used for on the face for laser protection. The Jako laryngoscope was entered into the oral cavity after placing a maxillary tooth protector. The larynx was visualized. The laryngoscope was attached to the Mayo stand using the suspension apparatus. The endotracheal tube was removed and it was replaced through the laryngoscope. This was done several times throughout the course of the case. The CO2 laser was used on a setting of 2 W continuous power with the microscope attachment and radial cuts were placed around the subglottic stenotic area which was quite thin. A total of 5 cuts were made at about 8:00, 10:00, 12:00, 2 and 4:00. The stenosis was then dilated with the 14 mm balloon up to a pressure of 4 atmosphere and held for 30 seconds x2 times. After this, a #7 endotracheal tube was placed through the laryngoscope without difficulty. After adequate ventilation was confirmed, the laryngoscope was removed. The care of the patient was then turned over to anesthesia for awakening and extubation.   PATIENT DISPOSITION:  PACU - hemodynamically  stable.

## 2011-05-18 NOTE — Anesthesia Preprocedure Evaluation (Addendum)
Anesthesia Evaluation  Patient identified by MRN, date of birth, ID band Patient awake    Reviewed: Allergy & Precautions, H&P , NPO status , Patient's Chart, lab work & pertinent test results, reviewed documented beta blocker date and time   Airway Mallampati: II TM Distance: >3 FB Neck ROM: full    Dental   Pulmonary neg pulmonary ROS,          Cardiovascular hypertension, On Medications and On Home Beta Blockers     Neuro/Psych Negative Neurological ROS  Negative Psych ROS   GI/Hepatic negative GI ROS, Neg liver ROS, GERD-  Medicated and Controlled,  Endo/Other  Diabetes mellitus-, Well Controlled, Type 2, Oral Hypoglycemic Agents  Renal/GU negative Renal ROS  Genitourinary negative   Musculoskeletal   Abdominal   Peds  Hematology negative hematology ROS (+)   Anesthesia Other Findings See surgeon's H&P   Reproductive/Obstetrics negative OB ROS                           Anesthesia Physical Anesthesia Plan  ASA: III  Anesthesia Plan: General   Post-op Pain Management:    Induction: Intravenous  Airway Management Planned: Oral ETT  Additional Equipment:   Intra-op Plan:   Post-operative Plan: Extubation in OR  Informed Consent: I have reviewed the patients History and Physical, chart, labs and discussed the procedure including the risks, benefits and alternatives for the proposed anesthesia with the patient or authorized representative who has indicated his/her understanding and acceptance.     Plan Discussed with: CRNA and Surgeon  Anesthesia Plan Comments:         Anesthesia Quick Evaluation

## 2011-06-29 ENCOUNTER — Other Ambulatory Visit: Payer: Self-pay | Admitting: Family Medicine

## 2011-06-29 DIAGNOSIS — Z1231 Encounter for screening mammogram for malignant neoplasm of breast: Secondary | ICD-10-CM

## 2011-07-21 ENCOUNTER — Ambulatory Visit
Admission: RE | Admit: 2011-07-21 | Discharge: 2011-07-21 | Disposition: A | Discharge status: Home / Self Care | Payer: PRIVATE HEALTH INSURANCE | Source: Ambulatory Visit | Attending: Family Medicine | Admitting: Family Medicine

## 2011-07-21 DIAGNOSIS — Z1231 Encounter for screening mammogram for malignant neoplasm of breast: Secondary | ICD-10-CM

## 2011-09-20 ENCOUNTER — Ambulatory Visit: Payer: BC Managed Care – PPO | Attending: Family Medicine

## 2011-09-20 DIAGNOSIS — R5381 Other malaise: Secondary | ICD-10-CM | POA: Insufficient documentation

## 2011-09-20 DIAGNOSIS — M25519 Pain in unspecified shoulder: Secondary | ICD-10-CM | POA: Insufficient documentation

## 2011-09-20 DIAGNOSIS — M25619 Stiffness of unspecified shoulder, not elsewhere classified: Secondary | ICD-10-CM | POA: Insufficient documentation

## 2011-09-20 DIAGNOSIS — IMO0001 Reserved for inherently not codable concepts without codable children: Secondary | ICD-10-CM | POA: Insufficient documentation

## 2011-09-22 ENCOUNTER — Ambulatory Visit: Payer: BC Managed Care – PPO

## 2011-09-26 ENCOUNTER — Ambulatory Visit: Payer: BC Managed Care – PPO

## 2011-09-28 ENCOUNTER — Ambulatory Visit: Payer: BC Managed Care – PPO | Admitting: Physical Therapy

## 2011-10-02 ENCOUNTER — Ambulatory Visit: Payer: BC Managed Care – PPO | Admitting: Physical Therapy

## 2011-10-11 ENCOUNTER — Ambulatory Visit: Payer: BC Managed Care – PPO | Admitting: Physical Therapy

## 2011-10-23 ENCOUNTER — Ambulatory Visit: Payer: BC Managed Care – PPO | Attending: Family Medicine

## 2011-10-23 DIAGNOSIS — M25519 Pain in unspecified shoulder: Secondary | ICD-10-CM | POA: Insufficient documentation

## 2011-10-23 DIAGNOSIS — R5381 Other malaise: Secondary | ICD-10-CM | POA: Insufficient documentation

## 2011-10-23 DIAGNOSIS — M25619 Stiffness of unspecified shoulder, not elsewhere classified: Secondary | ICD-10-CM | POA: Insufficient documentation

## 2011-10-23 DIAGNOSIS — IMO0001 Reserved for inherently not codable concepts without codable children: Secondary | ICD-10-CM | POA: Insufficient documentation

## 2011-10-25 ENCOUNTER — Ambulatory Visit: Payer: BC Managed Care – PPO

## 2011-11-03 ENCOUNTER — Ambulatory Visit: Payer: BC Managed Care – PPO | Admitting: Physical Therapy

## 2011-11-09 ENCOUNTER — Ambulatory Visit: Payer: BC Managed Care – PPO | Admitting: Physical Therapy

## 2011-11-13 ENCOUNTER — Ambulatory Visit: Payer: BC Managed Care – PPO | Admitting: Physical Therapy

## 2011-11-15 ENCOUNTER — Ambulatory Visit: Payer: BC Managed Care – PPO | Admitting: Physical Therapy

## 2011-11-21 ENCOUNTER — Ambulatory Visit: Payer: BC Managed Care – PPO | Attending: Family Medicine

## 2011-11-21 DIAGNOSIS — R5381 Other malaise: Secondary | ICD-10-CM | POA: Insufficient documentation

## 2011-11-21 DIAGNOSIS — M25619 Stiffness of unspecified shoulder, not elsewhere classified: Secondary | ICD-10-CM | POA: Insufficient documentation

## 2011-11-21 DIAGNOSIS — M25519 Pain in unspecified shoulder: Secondary | ICD-10-CM | POA: Insufficient documentation

## 2011-11-21 DIAGNOSIS — IMO0001 Reserved for inherently not codable concepts without codable children: Secondary | ICD-10-CM | POA: Insufficient documentation

## 2011-11-23 ENCOUNTER — Ambulatory Visit: Payer: BC Managed Care – PPO | Admitting: Physical Therapy

## 2011-11-27 ENCOUNTER — Ambulatory Visit: Payer: BC Managed Care – PPO | Admitting: Physical Therapy

## 2011-11-29 ENCOUNTER — Ambulatory Visit: Payer: BC Managed Care – PPO | Admitting: Physical Therapy

## 2011-12-01 ENCOUNTER — Ambulatory Visit: Payer: BC Managed Care – PPO | Admitting: Physical Therapy

## 2011-12-04 ENCOUNTER — Ambulatory Visit: Payer: BC Managed Care – PPO | Admitting: Physical Therapy

## 2011-12-06 ENCOUNTER — Ambulatory Visit: Payer: BC Managed Care – PPO | Admitting: Physical Therapy

## 2011-12-11 ENCOUNTER — Ambulatory Visit: Payer: BC Managed Care – PPO | Admitting: Physical Therapy

## 2011-12-13 ENCOUNTER — Ambulatory Visit: Payer: BC Managed Care – PPO | Admitting: Physical Therapy

## 2011-12-18 ENCOUNTER — Ambulatory Visit: Payer: BC Managed Care – PPO | Admitting: Physical Therapy

## 2011-12-20 ENCOUNTER — Ambulatory Visit: Payer: BC Managed Care – PPO | Attending: Family Medicine | Admitting: Physical Therapy

## 2011-12-20 ENCOUNTER — Other Ambulatory Visit: Payer: Self-pay | Admitting: Orthopedic Surgery

## 2011-12-20 DIAGNOSIS — M25519 Pain in unspecified shoulder: Secondary | ICD-10-CM | POA: Insufficient documentation

## 2011-12-20 DIAGNOSIS — IMO0001 Reserved for inherently not codable concepts without codable children: Secondary | ICD-10-CM | POA: Insufficient documentation

## 2011-12-20 DIAGNOSIS — R5381 Other malaise: Secondary | ICD-10-CM | POA: Insufficient documentation

## 2011-12-20 DIAGNOSIS — M25512 Pain in left shoulder: Secondary | ICD-10-CM

## 2011-12-20 DIAGNOSIS — M25619 Stiffness of unspecified shoulder, not elsewhere classified: Secondary | ICD-10-CM | POA: Insufficient documentation

## 2011-12-21 ENCOUNTER — Ambulatory Visit
Admission: RE | Admit: 2011-12-21 | Discharge: 2011-12-21 | Disposition: A | Discharge status: Home / Self Care | Payer: BC Managed Care – PPO | Source: Ambulatory Visit | Attending: Orthopedic Surgery | Admitting: Orthopedic Surgery

## 2011-12-21 DIAGNOSIS — M25512 Pain in left shoulder: Secondary | ICD-10-CM

## 2012-07-29 NOTE — Consult Note (Signed)
Victoria Mccormick is an 61 y.o. female.   Chief Complaint: stridor HPI: History of idiopathic subglottic stenosis, requiring periodic laryngoscopy with balloon dilation.  Past Medical History  Diagnosis Date  . Hypertension   . Diabetes mellitus   . GERD (gastroesophageal reflux disease)   . Hyperlipemia     Past Surgical History  Procedure Laterality Date  . Direct laryngoscopy      glottic stenosis dilated-multiple times-2013 #8  . Cesarean section      x2    No family history on file. Social History:  reports that she has never smoked. She does not have any smokeless tobacco history on file. She reports that  drinks alcohol. She reports that she does not use illicit drugs.  Allergies: No Known Allergies  No prescriptions prior to admission    No results found for this or any previous visit (from the past 48 hour(s)). No results found.  ROS: otherwise negative  There were no vitals taken for this visit.  PHYSICAL EXAM: Overall appearance:  Overweight lady, with inspiratory stridor, in no distress Head:  Normocephalic, atraumatic. Ears: External auditory canals are clear; tympanic membranes are intact and the middle ears are free of any effusion. Nose: External nose is healthy in appearance. Internal nasal exam free of any lesions or obstruction. Oral Cavity/pharynx:  There are no mucosal lesions or masses identified. Hypopharynx/Larynx: no signs of any mucosal lesions or masses identified. Vocal cords move normally. Neuro:  No identifiable neurologic deficits. Neck: No palpable neck masses.  Studies Reviewed: none    Assessment/Plan Proceed with microlaryngoscopy, laser and balloon dilation.  Victoria Mccormick 07/29/2012, 12:13 PM    

## 2012-08-06 ENCOUNTER — Encounter (HOSPITAL_BASED_OUTPATIENT_CLINIC_OR_DEPARTMENT_OTHER): Payer: Self-pay | Admitting: *Deleted

## 2012-08-06 NOTE — Progress Notes (Signed)
Husband fell and died from complication 3/14-she has been here multiple times-to come for ekg and bmet- Has lost 40 lb since last yr- Is NOT supposed to stay overnight

## 2012-08-08 ENCOUNTER — Other Ambulatory Visit: Payer: Self-pay

## 2012-08-08 ENCOUNTER — Encounter (HOSPITAL_BASED_OUTPATIENT_CLINIC_OR_DEPARTMENT_OTHER)
Admission: RE | Admit: 2012-08-08 | Discharge: 2012-08-08 | Disposition: A | Discharge status: Home / Self Care | Payer: BC Managed Care – PPO | Source: Ambulatory Visit | Attending: Otolaryngology | Admitting: Otolaryngology

## 2012-08-08 DIAGNOSIS — J398 Other specified diseases of upper respiratory tract: Secondary | ICD-10-CM | POA: Diagnosis not present

## 2012-08-08 DIAGNOSIS — K219 Gastro-esophageal reflux disease without esophagitis: Secondary | ICD-10-CM | POA: Diagnosis not present

## 2012-08-08 DIAGNOSIS — E119 Type 2 diabetes mellitus without complications: Secondary | ICD-10-CM | POA: Diagnosis not present

## 2012-08-08 DIAGNOSIS — Z01812 Encounter for preprocedural laboratory examination: Secondary | ICD-10-CM | POA: Diagnosis not present

## 2012-08-08 DIAGNOSIS — E785 Hyperlipidemia, unspecified: Secondary | ICD-10-CM | POA: Diagnosis not present

## 2012-08-08 DIAGNOSIS — J386 Stenosis of larynx: Secondary | ICD-10-CM | POA: Diagnosis not present

## 2012-08-08 DIAGNOSIS — I10 Essential (primary) hypertension: Secondary | ICD-10-CM | POA: Diagnosis not present

## 2012-08-08 DIAGNOSIS — R061 Stridor: Secondary | ICD-10-CM | POA: Diagnosis present

## 2012-08-08 LAB — BASIC METABOLIC PANEL
BUN: 17 mg/dL (ref 6–23)
Chloride: 101 mEq/L (ref 96–112)
Glucose, Bld: 113 mg/dL — ABNORMAL HIGH (ref 70–99)
Potassium: 4 mEq/L (ref 3.5–5.1)

## 2012-08-15 ENCOUNTER — Encounter (HOSPITAL_BASED_OUTPATIENT_CLINIC_OR_DEPARTMENT_OTHER): Payer: Self-pay

## 2012-08-15 ENCOUNTER — Ambulatory Visit (HOSPITAL_BASED_OUTPATIENT_CLINIC_OR_DEPARTMENT_OTHER): Payer: BC Managed Care – PPO | Admitting: Anesthesiology

## 2012-08-15 ENCOUNTER — Ambulatory Visit (HOSPITAL_BASED_OUTPATIENT_CLINIC_OR_DEPARTMENT_OTHER)
Admission: RE | Admit: 2012-08-15 | Discharge: 2012-08-15 | Disposition: A | Discharge status: Home / Self Care | Payer: BC Managed Care – PPO | Source: Ambulatory Visit | Attending: Otolaryngology | Admitting: Otolaryngology

## 2012-08-15 ENCOUNTER — Encounter (HOSPITAL_BASED_OUTPATIENT_CLINIC_OR_DEPARTMENT_OTHER): Payer: Self-pay | Admitting: Anesthesiology

## 2012-08-15 ENCOUNTER — Encounter (HOSPITAL_BASED_OUTPATIENT_CLINIC_OR_DEPARTMENT_OTHER)
Admission: RE | Disposition: A | Discharge status: Home / Self Care | Payer: Self-pay | Source: Ambulatory Visit | Attending: Otolaryngology

## 2012-08-15 DIAGNOSIS — Z01812 Encounter for preprocedural laboratory examination: Secondary | ICD-10-CM | POA: Insufficient documentation

## 2012-08-15 DIAGNOSIS — J386 Stenosis of larynx: Secondary | ICD-10-CM | POA: Diagnosis not present

## 2012-08-15 DIAGNOSIS — E119 Type 2 diabetes mellitus without complications: Secondary | ICD-10-CM | POA: Insufficient documentation

## 2012-08-15 DIAGNOSIS — I10 Essential (primary) hypertension: Secondary | ICD-10-CM | POA: Insufficient documentation

## 2012-08-15 DIAGNOSIS — J398 Other specified diseases of upper respiratory tract: Secondary | ICD-10-CM | POA: Insufficient documentation

## 2012-08-15 DIAGNOSIS — K219 Gastro-esophageal reflux disease without esophagitis: Secondary | ICD-10-CM | POA: Insufficient documentation

## 2012-08-15 DIAGNOSIS — E663 Overweight: Secondary | ICD-10-CM | POA: Insufficient documentation

## 2012-08-15 DIAGNOSIS — Z6827 Body mass index (BMI) 27.0-27.9, adult: Secondary | ICD-10-CM | POA: Insufficient documentation

## 2012-08-15 DIAGNOSIS — E785 Hyperlipidemia, unspecified: Secondary | ICD-10-CM | POA: Insufficient documentation

## 2012-08-15 HISTORY — PX: MICROLARYNGOSCOPY WITH LASER AND BALLOON DILATION: SHX5973

## 2012-08-15 LAB — POCT HEMOGLOBIN-HEMACUE: Hemoglobin: 13.7 g/dL (ref 12.0–15.0)

## 2012-08-15 LAB — GLUCOSE, CAPILLARY: Glucose-Capillary: 123 mg/dL — ABNORMAL HIGH (ref 70–99)

## 2012-08-15 SURGERY — MICROLARYNGOSCOPY WITH LASER AND BALLOON DILATION
Anesthesia: General | Site: Throat | Wound class: Clean Contaminated

## 2012-08-15 MED ORDER — PROPOFOL 10 MG/ML IV BOLUS
INTRAVENOUS | Status: DC | PRN
  Administered 2012-08-15: 150 mg via INTRAVENOUS
  Administered 2012-08-15: 50 mg via INTRAVENOUS

## 2012-08-15 MED ORDER — LACTATED RINGERS IV SOLN
INTRAVENOUS | Status: DC
  Administered 2012-08-15: 11:00:00 via INTRAVENOUS

## 2012-08-15 MED ORDER — NEOSTIGMINE METHYLSULFATE 1 MG/ML IJ SOLN
INTRAMUSCULAR | Status: DC | PRN
  Administered 2012-08-15: 3 mg via INTRAVENOUS

## 2012-08-15 MED ORDER — MIDAZOLAM HCL 2 MG/2ML IJ SOLN: 1.0000 mg | INTRAMUSCULAR | Status: DC | PRN

## 2012-08-15 MED ORDER — FENTANYL CITRATE 0.05 MG/ML IJ SOLN: 50.0000 ug | INTRAMUSCULAR | Status: DC | PRN

## 2012-08-15 MED ORDER — GLYCOPYRROLATE 0.2 MG/ML IJ SOLN
INTRAMUSCULAR | Status: DC | PRN
  Administered 2012-08-15: 0.4 mg via INTRAVENOUS

## 2012-08-15 MED ORDER — HYDROMORPHONE HCL PF 1 MG/ML IJ SOLN: 0.2500 mg | INTRAMUSCULAR | Status: DC | PRN

## 2012-08-15 MED ORDER — MIDAZOLAM HCL 5 MG/5ML IJ SOLN
INTRAMUSCULAR | Status: DC | PRN
  Administered 2012-08-15: 2 mg via INTRAVENOUS

## 2012-08-15 MED ORDER — CEFAZOLIN SODIUM-DEXTROSE 2-3 GM-% IV SOLR
2.0000 g | INTRAVENOUS | Status: AC
  Administered 2012-08-15: 2 g via INTRAVENOUS

## 2012-08-15 MED ORDER — ONDANSETRON HCL 4 MG/2ML IJ SOLN
INTRAMUSCULAR | Status: DC | PRN
  Administered 2012-08-15: 4 mg via INTRAVENOUS

## 2012-08-15 MED ORDER — LIDOCAINE HCL (CARDIAC) 20 MG/ML IV SOLN
INTRAVENOUS | Status: DC | PRN
  Administered 2012-08-15: 100 mg via INTRAVENOUS

## 2012-08-15 MED ORDER — DEXAMETHASONE SODIUM PHOSPHATE 4 MG/ML IJ SOLN
INTRAMUSCULAR | Status: DC | PRN
  Administered 2012-08-15: 8 mg via INTRAVENOUS

## 2012-08-15 MED ORDER — FENTANYL CITRATE 0.05 MG/ML IJ SOLN
INTRAMUSCULAR | Status: DC | PRN
  Administered 2012-08-15: 100 ug via INTRAVENOUS

## 2012-08-15 MED ORDER — SUCCINYLCHOLINE CHLORIDE 20 MG/ML IJ SOLN
INTRAMUSCULAR | Status: DC | PRN
  Administered 2012-08-15: 100 mg via INTRAVENOUS

## 2012-08-15 MED ORDER — AMOXICILLIN 500 MG PO CAPS: 500.0000 mg | ORAL_CAPSULE | Freq: Three times a day (TID) | ORAL | Status: DC

## 2012-08-15 MED ORDER — ROCURONIUM BROMIDE 100 MG/10ML IV SOLN
INTRAVENOUS | Status: DC | PRN
  Administered 2012-08-15: 20 mg via INTRAVENOUS

## 2012-08-15 SURGICAL SUPPLY — 31 items
BALLN PULM 15 16.5 18X75 (BALLOONS)
BALLN PULMONARY 10-12 (MISCELLANEOUS) ×1 IMPLANT
BALLN PULMONARY 8-10 OD75 (MISCELLANEOUS) IMPLANT
BALLOON PULM 15 16.5 18X75 (BALLOONS) IMPLANT
CANISTER SUCTION 1200CC (MISCELLANEOUS) ×2 IMPLANT
CLOTH BEACON ORANGE TIMEOUT ST (SAFETY) ×2 IMPLANT
DEPRESSOR TONGUE BLADE STERILE (MISCELLANEOUS) ×2 IMPLANT
FILTER 7/8 IN (FILTER) IMPLANT
GAUZE SPONGE 4X4 12PLY STRL LF (GAUZE/BANDAGES/DRESSINGS) ×4 IMPLANT
GLOVE ECLIPSE 6.5 STRL STRAW (GLOVE) ×1 IMPLANT
GLOVE ECLIPSE 7.5 STRL STRAW (GLOVE) ×2 IMPLANT
GOWN PREVENTION PLUS XLARGE (GOWN DISPOSABLE) IMPLANT
GOWN PREVENTION PLUS XXLARGE (GOWN DISPOSABLE) IMPLANT
GUARD TEETH (MISCELLANEOUS) IMPLANT
MARKER SKIN DUAL TIP RULER LAB (MISCELLANEOUS) IMPLANT
NDL SPNL 22GX7 QUINCKE BK (NEEDLE) IMPLANT
NEEDLE SPNL 22GX7 QUINCKE BK (NEEDLE) IMPLANT
NS IRRIG 1000ML POUR BTL (IV SOLUTION) ×2 IMPLANT
PAD EYE OVAL STERILE LF (GAUZE/BANDAGES/DRESSINGS) ×4 IMPLANT
PATTIES SURGICAL .5 X3 (DISPOSABLE) ×2 IMPLANT
REDUCTION FITTING 1/4 IN (FILTER) IMPLANT
SHEET MEDIUM DRAPE 40X70 STRL (DRAPES) ×2 IMPLANT
SLEEVE SCD COMPRESS KNEE MED (MISCELLANEOUS) ×2 IMPLANT
SOLUTION BUTLER CLEAR DIP (MISCELLANEOUS) IMPLANT
SURGILUBE 2OZ TUBE FLIPTOP (MISCELLANEOUS) IMPLANT
SYR 5ML LL (SYRINGE) ×1 IMPLANT
SYR CONTROL 10ML LL (SYRINGE) ×1 IMPLANT
SYR INFLATE BILIARY GAUGE (MISCELLANEOUS) IMPLANT
SYR TB 1ML LL NO SAFETY (SYRINGE) IMPLANT
TOWEL OR 17X24 6PK STRL BLUE (TOWEL DISPOSABLE) ×1 IMPLANT
TUBE CONNECTING 20X1/4 (TUBING) ×3 IMPLANT

## 2012-08-15 NOTE — Transfer of Care (Signed)
Immediate Anesthesia Transfer of Care Note  Patient: Victoria Mccormick  Procedure(s) Performed: Procedure(s) with comments: MICROLARYNGOSCOPY WITH LASER AND BALLOON DILATION (N/A) - Tracheal Dilation  Patient Location: PACU  Anesthesia Type:General  Level of Consciousness: awake, alert  and oriented  Airway & Oxygen Therapy: Patient Spontanous Breathing and Patient connected to T-piece oxygen  Post-op Assessment: Report given to PACU RN and Post -op Vital signs reviewed and stable  Post vital signs: Reviewed and stable  Complications: No apparent anesthesia complications

## 2012-08-15 NOTE — H&P (View-Only) (Signed)
Victoria Mccormick is an 62 y.o. female.   Chief Complaint: stridor HPI: History of idiopathic subglottic stenosis, requiring periodic laryngoscopy with balloon dilation.  Past Medical History  Diagnosis Date  . Hypertension   . Diabetes mellitus   . GERD (gastroesophageal reflux disease)   . Hyperlipemia     Past Surgical History  Procedure Laterality Date  . Direct laryngoscopy      glottic stenosis dilated-multiple times-2013 #8  . Cesarean section      x2    No family history on file. Social History:  reports that she has never smoked. She does not have any smokeless tobacco history on file. She reports that  drinks alcohol. She reports that she does not use illicit drugs.  Allergies: No Known Allergies  No prescriptions prior to admission    No results found for this or any previous visit (from the past 48 hour(s)). No results found.  ROS: otherwise negative  There were no vitals taken for this visit.  PHYSICAL EXAM: Overall appearance:  Overweight lady, with inspiratory stridor, in no distress Head:  Normocephalic, atraumatic. Ears: External auditory canals are clear; tympanic membranes are intact and the middle ears are free of any effusion. Nose: External nose is healthy in appearance. Internal nasal exam free of any lesions or obstruction. Oral Cavity/pharynx:  There are no mucosal lesions or masses identified. Hypopharynx/Larynx: no signs of any mucosal lesions or masses identified. Vocal cords move normally. Neuro:  No identifiable neurologic deficits. Neck: No palpable neck masses.  Studies Reviewed: none    Assessment/Plan Proceed with microlaryngoscopy, laser and balloon dilation.  [name] 07/29/2012, 12:13 PM

## 2012-08-15 NOTE — Anesthesia Preprocedure Evaluation (Addendum)
Anesthesia Evaluation  Patient identified by MRN, date of birth, ID band Patient awake    Reviewed: Allergy & Precautions, H&P , NPO status , Patient's Chart, lab work & pertinent test results  History of Anesthesia Complications (+) PONV  Airway Mallampati: II      Dental   Pulmonary  breath sounds clear to auscultation        Cardiovascular hypertension, Pt. on medications Rhythm:Regular Rate:Normal     Neuro/Psych    GI/Hepatic Neg liver ROS, GERD-  ,  Endo/Other  diabetes  Renal/GU negative Renal ROS     Musculoskeletal   Abdominal   Peds  Hematology   Anesthesia Other Findings   Reproductive/Obstetrics                          Anesthesia Physical Anesthesia Plan  ASA: III  Anesthesia Plan: General   Post-op Pain Management:    Induction: Intravenous  Airway Management Planned: Oral ETT  Additional Equipment:   Intra-op Plan:   Post-operative Plan: Extubation in OR  Informed Consent: I have reviewed the patients History and Physical, chart, labs and discussed the procedure including the risks, benefits and alternatives for the proposed anesthesia with the patient or authorized representative who has indicated his/her understanding and acceptance.   Dental advisory given  Plan Discussed with: CRNA and Anesthesiologist  Anesthesia Plan Comments:         Anesthesia Quick Evaluation

## 2012-08-15 NOTE — Anesthesia Postprocedure Evaluation (Signed)
  Anesthesia Post-op Note  Patient: Victoria Mccormick  Procedure(s) Performed: Procedure(s) with comments: MICROLARYNGOSCOPY WITH LASER AND BALLOON DILATION (N/A) - Tracheal Dilation  Patient Location: PACU  Anesthesia Type:General  Level of Consciousness: awake, alert  and oriented  Airway and Oxygen Therapy: Patient Spontanous Breathing  Post-op Pain: mild  Post-op Assessment: Post-op Vital signs reviewed, Patient's Cardiovascular Status Stable, Respiratory Function Stable, Patent Airway and No signs of Nausea or vomiting  Post-op Vital Signs: Reviewed and stable  Complications: No apparent anesthesia complications

## 2012-08-15 NOTE — Op Note (Signed)
OPERATIVE REPORT  DATE OF SURGERY: 08/15/2012  PATIENT:  Victoria Mccormick,  62 y.o. female  PRE-OPERATIVE DIAGNOSIS:  tracheal stenosis  POST-OPERATIVE DIAGNOSIS:  tracheal stenosis  PROCEDURE:  Procedure(s): MICROLARYNGOSCOPY WITH LASER AND BALLOON DILATION  SURGEON:  Susy Frizzle, MD  ASSISTANTS: none  ANESTHESIA:   General   EBL:  0 ml  DRAINS: none  LOCAL MEDICATIONS USED:  None  SPECIMEN:  none  COUNTS:  Correct  PROCEDURE DETAILS: The patient was taken to the operating room and placed on the operating table in the supine position. Following induction of general endotracheal anesthesia using a #5 endotracheal tube, the table was turned 90. The maxillary tooth protector was used throughout the case. The patient was draped in the standard fashion. A Jako laryngoscope was entered into the oral cavity and used to evaluate the larynx. This was secured to the Mayo stand using the suspension apparatus. The endotracheal tube was removed and then replaced through the laryngoscope and this was done repeatedly throughout the case. Preoperative microscope was brought into view. The tube was removed and the subglottic region was inspected. A CO2 laser on a setting of 3 W continuous pars used to create radial cuts through the stenotic segment. A CRE balloon dilator was then used, the first time dilated up to 11 mm, and then 2 further times up to 12 mm. Following that, a #7 endotracheal tube was placed without difficulty. The scope was removed. The patient was awakened, extubated and transferred to recovery in stable condition.    PATIENT DISPOSITION:  To PACU, stable

## 2012-08-15 NOTE — Anesthesia Procedure Notes (Addendum)
Procedure Name: Intubation Performed by: York Grice Pre-anesthesia Checklist: Patient identified, Timeout performed, Emergency Drugs available, Suction available and Patient being monitored Patient Re-evaluated:Patient Re-evaluated prior to inductionOxygen Delivery Method: Circle system utilized Preoxygenation: Pre-oxygenation with 100% oxygen Intubation Type: IV induction Ventilation: Mask ventilation without difficulty Laryngoscope Size: Miller and 2 Grade View: Grade II Laser Tube: Cuffed inflated with minimal occlusive pressure - saline Tube size: 4.5 mm Number of attempts: 2 Airway Equipment and Method: Stylet Placement Confirmation: breath sounds checked- equal and bilateral and positive ETCO2 Tube secured with: Tape Dental Injury: Teeth and Oropharynx as per pre-operative assessment    Performed by: York Grice Tube size: 7.0 mm Number of attempts: 1

## 2012-08-15 NOTE — Interval H&P Note (Signed)
History and Physical Interval Note:  08/15/2012 11:28 AM  Victoria Mccormick  has presented today for surgery, with the diagnosis of stenosis  The various methods of treatment have been discussed with the patient and family. After consideration of risks, benefits and other options for treatment, the patient has consented to  Procedure(s): MICROLARYNGOSCOPY WITH LASER AND BALLOON DILATION (N/A) as a surgical intervention .  The patient's history has been reviewed, patient examined, no change in status, stable for surgery.  I have reviewed the patient's chart and labs.  Questions were answered to the patient's satisfaction.     Tawana Pasch

## 2012-08-16 ENCOUNTER — Encounter (HOSPITAL_BASED_OUTPATIENT_CLINIC_OR_DEPARTMENT_OTHER): Payer: Self-pay | Admitting: Otolaryngology

## 2013-01-27 ENCOUNTER — Encounter (HOSPITAL_BASED_OUTPATIENT_CLINIC_OR_DEPARTMENT_OTHER): Payer: Self-pay | Admitting: *Deleted

## 2013-01-27 NOTE — Progress Notes (Signed)
To come in for bmet 

## 2013-01-28 ENCOUNTER — Encounter (HOSPITAL_BASED_OUTPATIENT_CLINIC_OR_DEPARTMENT_OTHER)
Admission: RE | Admit: 2013-01-28 | Discharge: 2013-01-28 | Disposition: A | Discharge status: Home / Self Care | Payer: BC Managed Care – PPO | Source: Ambulatory Visit | Attending: Otolaryngology | Admitting: Otolaryngology

## 2013-01-28 LAB — BASIC METABOLIC PANEL
BUN: 18 mg/dL (ref 6–23)
CO2: 30 mEq/L (ref 19–32)
Calcium: 9.4 mg/dL (ref 8.4–10.5)
Chloride: 99 mEq/L (ref 96–112)
GFR calc non Af Amer: 69 mL/min — ABNORMAL LOW (ref >90)
Glucose, Bld: 177 mg/dL — ABNORMAL HIGH (ref 70–99)
Sodium: 139 mEq/L (ref 135–145)

## 2013-01-30 NOTE — H&P (Signed)
Victoria Mccormick is an 62 y.o. female.   Chief Complaint: Subglottic stenosis HPI: History of idiopathic subglottic stenosis, having stridor and exercise intolerance again recently. Last dilation May 2014.  Past Medical History  Diagnosis Date  . Hypertension   . Diabetes mellitus   . GERD (gastroesophageal reflux disease)   . Hyperlipemia     Past Surgical History  Procedure Laterality Date  . Direct laryngoscopy      glottic stenosis dilated-multiple times-2003to -2013 #8  . Cesarean section      x2  . Microlaryngoscopy with laser and balloon dilation N/A 08/15/2012    Procedure: MICROLARYNGOSCOPY WITH LASER AND BALLOON DILATION;  Surgeon: Victoria Colonel, MD;  Location: [facility];  Service: ENT;  Laterality: N/A;  Tracheal Dilation    History reviewed. No pertinent family history. Social History:  reports that she has never smoked. She does not have any smokeless tobacco history on file. She reports that she drinks alcohol. She reports that she does not use illicit drugs.  Allergies: No Known Allergies  No prescriptions prior to admission    No results found for this or any previous visit (from the past 48 hour(s)). No results found.  ROS: otherwise negative  Height 5' 7.5" (1.715 m), weight 170 lb (77.111 kg).  PHYSICAL EXAM: Overall appearance:  Healthy appearing, in no distress, mild inspiratory stridor. Head:  Normocephalic, atraumatic. Ears: External auditory canals are clear; tympanic membranes are intact and the middle ears are free of any effusion. Nose: External nose is healthy in appearance. Internal nasal exam free of any lesions or obstruction. Oral Cavity/pharynx:  There are no mucosal lesions or masses identified. Neuro:  No identifiable neurologic deficits. Neck: No palpable neck masses.  Studies Reviewed: none    Assessment/Plan Laser laryngoscopy with balloon dilation.  Victoria Mccormick 01/30/2013, 3:50 PM

## 2013-01-31 ENCOUNTER — Encounter (HOSPITAL_BASED_OUTPATIENT_CLINIC_OR_DEPARTMENT_OTHER)
Admission: RE | Disposition: A | Discharge status: Home / Self Care | Payer: Self-pay | Source: Ambulatory Visit | Attending: Otolaryngology

## 2013-01-31 ENCOUNTER — Ambulatory Visit (HOSPITAL_BASED_OUTPATIENT_CLINIC_OR_DEPARTMENT_OTHER)
Admission: RE | Admit: 2013-01-31 | Discharge: 2013-01-31 | Disposition: A | Discharge status: Home / Self Care | Payer: BC Managed Care – PPO | Source: Ambulatory Visit | Attending: Otolaryngology | Admitting: Otolaryngology

## 2013-01-31 ENCOUNTER — Encounter (HOSPITAL_BASED_OUTPATIENT_CLINIC_OR_DEPARTMENT_OTHER): Payer: BC Managed Care – PPO | Admitting: Anesthesiology

## 2013-01-31 ENCOUNTER — Ambulatory Visit (HOSPITAL_BASED_OUTPATIENT_CLINIC_OR_DEPARTMENT_OTHER): Payer: BC Managed Care – PPO | Admitting: Anesthesiology

## 2013-01-31 ENCOUNTER — Encounter (HOSPITAL_BASED_OUTPATIENT_CLINIC_OR_DEPARTMENT_OTHER): Payer: Self-pay | Admitting: Anesthesiology

## 2013-01-31 DIAGNOSIS — E119 Type 2 diabetes mellitus without complications: Secondary | ICD-10-CM | POA: Insufficient documentation

## 2013-01-31 DIAGNOSIS — J398 Other specified diseases of upper respiratory tract: Secondary | ICD-10-CM | POA: Insufficient documentation

## 2013-01-31 DIAGNOSIS — I10 Essential (primary) hypertension: Secondary | ICD-10-CM | POA: Insufficient documentation

## 2013-01-31 DIAGNOSIS — K219 Gastro-esophageal reflux disease without esophagitis: Secondary | ICD-10-CM | POA: Insufficient documentation

## 2013-01-31 DIAGNOSIS — E785 Hyperlipidemia, unspecified: Secondary | ICD-10-CM | POA: Insufficient documentation

## 2013-01-31 DIAGNOSIS — J386 Stenosis of larynx: Secondary | ICD-10-CM | POA: Insufficient documentation

## 2013-01-31 HISTORY — PX: MICROLARYNGOSCOPY WITH LASER AND BALLOON DILATION: SHX5973

## 2013-01-31 LAB — GLUCOSE, CAPILLARY: Glucose-Capillary: 118 mg/dL — ABNORMAL HIGH (ref 70–99)

## 2013-01-31 SURGERY — MICROLARYNGOSCOPY WITH LASER AND BALLOON DILATION
Anesthesia: General | Site: Throat | Wound class: Clean Contaminated

## 2013-01-31 MED ORDER — FENTANYL CITRATE 0.05 MG/ML IJ SOLN: 50.0000 ug | INTRAMUSCULAR | Status: DC | PRN

## 2013-01-31 MED ORDER — FENTANYL CITRATE 0.05 MG/ML IJ SOLN
INTRAMUSCULAR | Status: DC | PRN
  Administered 2013-01-31: 100 ug via INTRAVENOUS

## 2013-01-31 MED ORDER — HYDROMORPHONE HCL PF 1 MG/ML IJ SOLN: 0.2500 mg | INTRAMUSCULAR | Status: DC | PRN

## 2013-01-31 MED ORDER — LIDOCAINE HCL (CARDIAC) 20 MG/ML IV SOLN
INTRAVENOUS | Status: DC | PRN
  Administered 2013-01-31: 80 mg via INTRAVENOUS

## 2013-01-31 MED ORDER — ONDANSETRON HCL 4 MG/2ML IJ SOLN: 4.0000 mg | Freq: Once | INTRAMUSCULAR | Status: DC | PRN

## 2013-01-31 MED ORDER — ONDANSETRON HCL 4 MG/2ML IJ SOLN
INTRAMUSCULAR | Status: DC | PRN
  Administered 2013-01-31: 4 mg via INTRAVENOUS

## 2013-01-31 MED ORDER — ROCURONIUM BROMIDE 100 MG/10ML IV SOLN
INTRAVENOUS | Status: DC | PRN
  Administered 2013-01-31: 30 mg via INTRAVENOUS

## 2013-01-31 MED ORDER — DEXAMETHASONE SODIUM PHOSPHATE 4 MG/ML IJ SOLN
INTRAMUSCULAR | Status: DC | PRN
  Administered 2013-01-31: 5 mg via INTRAVENOUS

## 2013-01-31 MED ORDER — LACTATED RINGERS IV SOLN
INTRAVENOUS | Status: DC
  Administered 2013-01-31 (×2): via INTRAVENOUS

## 2013-01-31 MED ORDER — ROCURONIUM BROMIDE 50 MG/5ML IV SOLN
INTRAVENOUS | Status: AC
  Filled 2013-01-31: qty 1

## 2013-01-31 MED ORDER — OXYCODONE HCL 5 MG/5ML PO SOLN: 5.0000 mg | Freq: Once | ORAL | Status: DC | PRN

## 2013-01-31 MED ORDER — MIDAZOLAM HCL 5 MG/5ML IJ SOLN
INTRAMUSCULAR | Status: DC | PRN
  Administered 2013-01-31: 2 mg via INTRAVENOUS

## 2013-01-31 MED ORDER — PROPOFOL 10 MG/ML IV BOLUS
INTRAVENOUS | Status: DC | PRN
  Administered 2013-01-31: 200 mg via INTRAVENOUS

## 2013-01-31 MED ORDER — NEOSTIGMINE METHYLSULFATE 1 MG/ML IJ SOLN
INTRAMUSCULAR | Status: DC | PRN
  Administered 2013-01-31: 3 mg via INTRAVENOUS

## 2013-01-31 MED ORDER — MIDAZOLAM HCL 2 MG/2ML IJ SOLN: 1.0000 mg | INTRAMUSCULAR | Status: DC | PRN

## 2013-01-31 MED ORDER — FENTANYL CITRATE 0.05 MG/ML IJ SOLN
INTRAMUSCULAR | Status: AC
  Filled 2013-01-31: qty 4

## 2013-01-31 MED ORDER — EPINEPHRINE HCL 1 MG/ML IJ SOLN
INTRAMUSCULAR | Status: AC
  Filled 2013-01-31: qty 1

## 2013-01-31 MED ORDER — OXYCODONE HCL 5 MG PO TABS: 5.0000 mg | ORAL_TABLET | Freq: Once | ORAL | Status: DC | PRN

## 2013-01-31 MED ORDER — GLYCOPYRROLATE 0.2 MG/ML IJ SOLN
INTRAMUSCULAR | Status: DC | PRN
  Administered 2013-01-31: .4 mg via INTRAVENOUS

## 2013-01-31 MED ORDER — MIDAZOLAM HCL 2 MG/2ML IJ SOLN
INTRAMUSCULAR | Status: AC
  Filled 2013-01-31: qty 2

## 2013-01-31 MED ORDER — LIDOCAINE-EPINEPHRINE 1 %-1:100000 IJ SOLN
INTRAMUSCULAR | Status: AC
  Filled 2013-01-31: qty 1

## 2013-01-31 MED ORDER — PROPOFOL 10 MG/ML IV BOLUS
INTRAVENOUS | Status: AC
  Filled 2013-01-31: qty 20

## 2013-01-31 SURGICAL SUPPLY — 30 items
BALLN PULM 15 16.5 18X75 (BALLOONS) ×2
BALLN PULMONARY 10-12 (MISCELLANEOUS) IMPLANT
BALLN PULMONARY 8-10 OD75 (MISCELLANEOUS) IMPLANT
BALLOON PULM 15 16.5 18X75 (BALLOONS) IMPLANT
CANISTER SUCT 1200ML W/VALVE (MISCELLANEOUS) ×2 IMPLANT
DEPRESSOR TONGUE BLADE STERILE (MISCELLANEOUS) ×2 IMPLANT
FILTER 7/8 IN (FILTER) ×1 IMPLANT
GAUZE SPONGE 4X4 12PLY STRL LF (GAUZE/BANDAGES/DRESSINGS) ×4 IMPLANT
GLOVE ECLIPSE 7.5 STRL STRAW (GLOVE) ×2 IMPLANT
GLOVE SURG SS PI 7.0 STRL IVOR (GLOVE) ×2 IMPLANT
GOWN PREVENTION PLUS XLARGE (GOWN DISPOSABLE) ×1 IMPLANT
GOWN PREVENTION PLUS XXLARGE (GOWN DISPOSABLE) IMPLANT
GUARD TEETH (MISCELLANEOUS) ×1 IMPLANT
MARKER SKIN DUAL TIP RULER LAB (MISCELLANEOUS) IMPLANT
NDL SPNL 22GX7 QUINCKE BK (NEEDLE) IMPLANT
NEEDLE SPNL 22GX7 QUINCKE BK (NEEDLE) IMPLANT
NS IRRIG 1000ML POUR BTL (IV SOLUTION) ×2 IMPLANT
PAD EYE OVAL STERILE LF (GAUZE/BANDAGES/DRESSINGS) ×4 IMPLANT
PATTIES SURGICAL .5 X3 (DISPOSABLE) ×2 IMPLANT
REDUCTION FITTING 1/4 IN (FILTER) ×2 IMPLANT
SHEET MEDIUM DRAPE 40X70 STRL (DRAPES) ×2 IMPLANT
SLEEVE SCD COMPRESS KNEE MED (MISCELLANEOUS) ×2 IMPLANT
SOLUTION BUTLER CLEAR DIP (MISCELLANEOUS) IMPLANT
SURGILUBE 2OZ TUBE FLIPTOP (MISCELLANEOUS) IMPLANT
SYR 5ML LL (SYRINGE) ×1 IMPLANT
SYR CONTROL 10ML LL (SYRINGE) IMPLANT
SYR INFLATE BILIARY GAUGE (MISCELLANEOUS) IMPLANT
SYR TB 1ML LL NO SAFETY (SYRINGE) IMPLANT
TOWEL OR 17X24 6PK STRL BLUE (TOWEL DISPOSABLE) ×2 IMPLANT
TUBE CONNECTING 20X1/4 (TUBING) ×2 IMPLANT

## 2013-01-31 NOTE — Anesthesia Preprocedure Evaluation (Addendum)
Anesthesia Evaluation  Patient identified by MRN, date of birth, ID band Patient awake    Reviewed: Allergy & Precautions, H&P , NPO status , Patient's Chart, lab work & pertinent test results  Airway Mallampati: I TM Distance: >3 FB Neck ROM: Full    Dental  (+) Teeth Intact and Dental Advisory Given   Pulmonary  breath sounds clear to auscultation        Cardiovascular hypertension, Pt. on medications Rhythm:Regular Rate:Normal     Neuro/Psych    GI/Hepatic GERD-  Medicated and Controlled,  Endo/Other  diabetes, Well Controlled, Type 2, Oral Hypoglycemic Agents  Renal/GU      Musculoskeletal   Abdominal   Peds  Hematology   Anesthesia Other Findings   Reproductive/Obstetrics                          Anesthesia Physical Anesthesia Plan  ASA: III  Anesthesia Plan: General   Post-op Pain Management:    Induction: Intravenous  Airway Management Planned: Oral ETT  Additional Equipment:   Intra-op Plan:   Post-operative Plan: Extubation in OR  Informed Consent: I have reviewed the patients History and Physical, chart, labs and discussed the procedure including the risks, benefits and alternatives for the proposed anesthesia with the patient or authorized representative who has indicated his/her understanding and acceptance.   Dental advisory given  Plan Discussed with: Anesthesiologist, CRNA and Surgeon  Anesthesia Plan Comments:         Anesthesia Quick Evaluation

## 2013-01-31 NOTE — Interval H&P Note (Signed)
History and Physical Interval Note:  01/31/2013 8:24 AM  Victoria Mccormick  has presented today for surgery, with the diagnosis of tracheal stenosis  The various methods of treatment have been discussed with the patient and family. After consideration of risks, benefits and other options for treatment, the patient has consented to  Procedure(s): MICROLARYNGOSCOPY WITH LASER AND BALLOON DILATION (N/A) as a surgical intervention .  The patient's history has been reviewed, patient examined, no change in status, stable for surgery.  I have reviewed the patient's chart and labs.  Questions were answered to the patient's satisfaction.     Shemuel Harkleroad

## 2013-01-31 NOTE — Anesthesia Procedure Notes (Signed)
Procedure Name: Intubation Date/Time: 01/31/2013 8:52 AM Performed by: Burna Cash Pre-anesthesia Checklist: Patient identified, Emergency Drugs available, Suction available and Patient being monitored Patient Re-evaluated:Patient Re-evaluated prior to inductionOxygen Delivery Method: Circle System Utilized Preoxygenation: Pre-oxygenation with 100% oxygen Intubation Type: IV induction Ventilation: Mask ventilation without difficulty Laryngoscope Size: Mac, 3, Miller and 2 Grade View: Grade II Tube type: Oral Tube size: 5.0 mm Number of attempts: 2 Airway Equipment and Method: stylet and oral airway Placement Confirmation: ETT inserted through vocal cords under direct vision,  positive ETCO2 and breath sounds checked- equal and bilateral Secured at: 19 cm Tube secured with: Tape Dental Injury: Teeth and Oropharynx as per pre-operative assessment  Difficulty Due To: Difficult Airway- due to limited oral opening Comments: Mac #3 to [employer]

## 2013-01-31 NOTE — Op Note (Signed)
OPERATIVE REPORT  DATE OF SURGERY: 01/31/2013  PATIENT:  Victoria Mccormick,  62 y.o. female  PRE-OPERATIVE DIAGNOSIS:  tracheal stenosis  POST-OPERATIVE DIAGNOSIS:  tracheal stenosis  PROCEDURE:  Procedure(s): MICROLARYNGOSCOPY WITH LASER AND BALLOON DILATION  SURGEON:  Susy Frizzle, MD  ASSISTANTS: none  ANESTHESIA:   General   EBL:  5 ml  DRAINS: none  LOCAL MEDICATIONS USED:  None  SPECIMEN:  none  COUNTS:  Correct  PROCEDURE DETAILS: The patient was taken to the operating room and placed on the operating table in the supine position. Following induction of general endotracheal anesthesia with a #5 endotracheal tube which was placed with some difficulty, the table was turned 90 and the patient was draped in a standard fashion for endoscopy and laser using water soaked towels around the face and wet eye pads. A maxillary tooth protector was used throughout the case. A Jako style laser safe laryngoscope was used to visualize the larynx. The scope was attached to the Mayo stand with the suspension apparatus. The microscope was brought into the field. Endotracheal tube was removed and then easily replaced through the laryngoscope. This was done repeatedly throughout the case. The carbon dioxide laser at a setting of 3 W continuous  power was used to breakdown the stenotic segment with radial cuts at 11:00, 9:00, 6:00 and 4:00. The CR he balloon dilator was then used on 3 separate occasions, each time for 30 seconds. This was inflated up to 3.0 atmospheres which is equivalent to 15 mm. Following the dilation, a #7 endotracheal tube was placed without difficulty. The endoscope was then removed and the patient was handed back to the care of anesthesia for extubation and then transferred to recovery.    PATIENT DISPOSITION:  To PACU, stable

## 2013-01-31 NOTE — Anesthesia Postprocedure Evaluation (Signed)
  Anesthesia Post-op Note  Patient: Victoria Mccormick  Procedure(s) Performed: Procedure(s): MICROLARYNGOSCOPY WITH LASER AND BALLOON DILATION (N/A)  Patient Location: PACU  Anesthesia Type:General  Level of Consciousness: awake, alert  and oriented  Airway and Oxygen Therapy: Patient Spontanous Breathing and Patient connected to face mask oxygen  Post-op Pain: mild  Post-op Assessment: Post-op Vital signs reviewed  Post-op Vital Signs: Reviewed  Complications: No apparent anesthesia complications

## 2013-01-31 NOTE — Transfer of Care (Signed)
Immediate Anesthesia Transfer of Care Note  Patient: Victoria Mccormick  Procedure(s) Performed: Procedure(s): MICROLARYNGOSCOPY WITH LASER AND BALLOON DILATION (N/A)  Patient Location: PACU  Anesthesia Type:General  Level of Consciousness: awake, alert  and oriented  Airway & Oxygen Therapy: Patient Spontanous Breathing and Patient connected to face mask oxygen  Post-op Assessment: Report given to PACU RN and Post -op Vital signs reviewed and stable  Post vital signs: Reviewed and stable  Complications: No apparent anesthesia complications

## 2013-02-03 ENCOUNTER — Encounter (HOSPITAL_BASED_OUTPATIENT_CLINIC_OR_DEPARTMENT_OTHER): Payer: Self-pay | Admitting: Otolaryngology

## 2013-08-12 NOTE — H&P (Signed)
Victoria Mccormick is an 63 y.o. female.   Chief Complaint: Stridor  HPI: History of idiopathic subglottic stenosis.  Past Medical History  Diagnosis Date  . Hypertension   . Diabetes mellitus   . GERD (gastroesophageal reflux disease)   . Hyperlipemia     Past Surgical History  Procedure Laterality Date  . Direct laryngoscopy      glottic stenosis dilated-multiple times-2003to -2013 #8  . Cesarean section      x2  . Microlaryngoscopy with laser and balloon dilation N/A 08/15/2012    Procedure: MICROLARYNGOSCOPY WITH LASER AND BALLOON DILATION;  Surgeon: [name], MD;  Location: [place];  Service: ENT;  Laterality: N/A;  Tracheal Dilation  . Microlaryngoscopy with laser and balloon dilation N/A 01/31/2013    Procedure: MICROLARYNGOSCOPY WITH LASER AND BALLOON DILATION;  Surgeon: [name], MD;  Location: [place];  Service: ENT;  Laterality: N/A;    No family history on file. Social History:  reports that she has never smoked. She does not have any smokeless tobacco history on file. She reports that she drinks alcohol. She reports that she does not use illicit drugs.  Allergies: No Known Allergies  No prescriptions prior to admission    No results found for this or any previous visit (from the past 48 hour(s)). No results found.  ROS: otherwise negative  There were no vitals taken for this visit.  PHYSICAL EXAM: Overall appearance:  Healthy appearing, in no distress, with mild stridor. Head:  Normocephalic, atraumatic. Ears: External auditory canals are clear; tympanic membranes are intact and the middle ears are free of any effusion. Nose: External nose is healthy in appearance. Internal nasal exam free of any lesions or obstruction. Oral Cavity/pharynx:  There are no mucosal lesions or masses identified. Hypopharynx/Larynx: no signs of any mucosal lesions or masses identified. Vocal cords move normally. Neuro:  No identifiable  neurologic deficits. Neck: No palpable neck masses.  Studies Reviewed: none    Assessment/Plan Proceed with laser excision and balloon dilation.  [name] 08/12/2013, 1:03 PM

## 2013-08-13 ENCOUNTER — Encounter (HOSPITAL_BASED_OUTPATIENT_CLINIC_OR_DEPARTMENT_OTHER): Payer: Self-pay | Admitting: *Deleted

## 2013-08-13 NOTE — Progress Notes (Signed)
Has been here multiple times-to come in for [id number]

## 2013-08-14 ENCOUNTER — Ambulatory Visit (HOSPITAL_BASED_OUTPATIENT_CLINIC_OR_DEPARTMENT_OTHER)
Admission: RE | Admit: 2013-08-14 | Discharge: 2013-08-14 | Disposition: A | Discharge status: Home / Self Care | Payer: BC Managed Care – PPO | Source: Ambulatory Visit | Attending: Otolaryngology | Admitting: Otolaryngology

## 2013-08-14 LAB — BASIC METABOLIC PANEL
BUN: 17 mg/dL (ref 6–23)
CALCIUM: 9.7 mg/dL (ref 8.4–10.5)
CHLORIDE: 99 meq/L (ref 96–112)
CO2: 31 mEq/L (ref 19–32)
CREATININE: 0.82 mg/dL (ref 0.50–1.10)
GFR calc non Af Amer: 75 mL/min — ABNORMAL LOW (ref >90)
GFR, EST AFRICAN AMERICAN: 87 mL/min — AB (ref >90)
Glucose, Bld: 130 mg/dL — ABNORMAL HIGH (ref 70–99)
Potassium: 4.4 mEq/L (ref 3.7–5.3)
Sodium: 141 mEq/L (ref 137–147)

## 2013-08-18 ENCOUNTER — Ambulatory Visit (HOSPITAL_BASED_OUTPATIENT_CLINIC_OR_DEPARTMENT_OTHER): Payer: BC Managed Care – PPO | Admitting: Anesthesiology

## 2013-08-18 ENCOUNTER — Encounter (HOSPITAL_BASED_OUTPATIENT_CLINIC_OR_DEPARTMENT_OTHER)
Admission: RE | Disposition: A | Discharge status: Home / Self Care | Payer: Self-pay | Source: Ambulatory Visit | Attending: Otolaryngology

## 2013-08-18 ENCOUNTER — Encounter (HOSPITAL_BASED_OUTPATIENT_CLINIC_OR_DEPARTMENT_OTHER): Payer: Self-pay | Admitting: *Deleted

## 2013-08-18 ENCOUNTER — Ambulatory Visit (HOSPITAL_BASED_OUTPATIENT_CLINIC_OR_DEPARTMENT_OTHER)
Admission: RE | Admit: 2013-08-18 | Discharge: 2013-08-18 | Disposition: A | Discharge status: Home / Self Care | Payer: BC Managed Care – PPO | Source: Ambulatory Visit | Attending: Otolaryngology | Admitting: Otolaryngology

## 2013-08-18 ENCOUNTER — Encounter (HOSPITAL_BASED_OUTPATIENT_CLINIC_OR_DEPARTMENT_OTHER): Payer: BC Managed Care – PPO | Admitting: Anesthesiology

## 2013-08-18 DIAGNOSIS — J386 Stenosis of larynx: Secondary | ICD-10-CM | POA: Insufficient documentation

## 2013-08-18 DIAGNOSIS — E785 Hyperlipidemia, unspecified: Secondary | ICD-10-CM | POA: Insufficient documentation

## 2013-08-18 DIAGNOSIS — I10 Essential (primary) hypertension: Secondary | ICD-10-CM | POA: Insufficient documentation

## 2013-08-18 DIAGNOSIS — E119 Type 2 diabetes mellitus without complications: Secondary | ICD-10-CM | POA: Insufficient documentation

## 2013-08-18 DIAGNOSIS — K219 Gastro-esophageal reflux disease without esophagitis: Secondary | ICD-10-CM | POA: Insufficient documentation

## 2013-08-18 HISTORY — DX: Presence of spectacles and contact lenses: Z97.3

## 2013-08-18 HISTORY — PX: MICROLARYNGOSCOPY WITH LASER AND BALLOON DILATION: SHX5973

## 2013-08-18 LAB — GLUCOSE, CAPILLARY
GLUCOSE-CAPILLARY: 154 mg/dL — AB (ref 70–99)
Glucose-Capillary: 154 mg/dL — ABNORMAL HIGH (ref 70–99)

## 2013-08-18 LAB — POCT HEMOGLOBIN-HEMACUE: Hemoglobin: 13.2 g/dL (ref 12.0–15.0)

## 2013-08-18 SURGERY — MICROLARYNGOSCOPY WITH LASER AND BALLOON DILATION
Anesthesia: General | Site: Mouth

## 2013-08-18 MED ORDER — LIDOCAINE-EPINEPHRINE 1 %-1:100000 IJ SOLN
INTRAMUSCULAR | Status: AC
  Filled 2013-08-18: qty 1

## 2013-08-18 MED ORDER — CEFAZOLIN SODIUM-DEXTROSE 2-3 GM-% IV SOLR
INTRAVENOUS | Status: AC
  Filled 2013-08-18: qty 50

## 2013-08-18 MED ORDER — FENTANYL CITRATE 0.05 MG/ML IJ SOLN
INTRAMUSCULAR | Status: AC
  Filled 2013-08-18: qty 6

## 2013-08-18 MED ORDER — MIDAZOLAM HCL 2 MG/2ML IJ SOLN
INTRAMUSCULAR | Status: AC
  Filled 2013-08-18: qty 2

## 2013-08-18 MED ORDER — FENTANYL CITRATE 0.05 MG/ML IJ SOLN
50.0000 ug | INTRAMUSCULAR | Status: DC | PRN
Start: 2013-08-18 — End: 2013-08-18

## 2013-08-18 MED ORDER — MIDAZOLAM HCL 2 MG/2ML IJ SOLN: 0.5000 mg | Freq: Once | INTRAMUSCULAR | Status: DC | PRN

## 2013-08-18 MED ORDER — MIDAZOLAM HCL 2 MG/2ML IJ SOLN: 1.0000 mg | INTRAMUSCULAR | Status: DC | PRN

## 2013-08-18 MED ORDER — DEXAMETHASONE SODIUM PHOSPHATE 4 MG/ML IJ SOLN
INTRAMUSCULAR | Status: DC | PRN
  Administered 2013-08-18: 8 mg via INTRAVENOUS

## 2013-08-18 MED ORDER — OXYCODONE HCL 5 MG PO TABS: 5.0000 mg | ORAL_TABLET | Freq: Once | ORAL | Status: DC | PRN

## 2013-08-18 MED ORDER — EPINEPHRINE HCL 1 MG/ML IJ SOLN
INTRAMUSCULAR | Status: DC | PRN
  Administered 2013-08-18: 1 mg

## 2013-08-18 MED ORDER — ONDANSETRON HCL 4 MG/2ML IJ SOLN
INTRAMUSCULAR | Status: DC | PRN
  Administered 2013-08-18: 4 mg via INTRAVENOUS

## 2013-08-18 MED ORDER — LIDOCAINE HCL (CARDIAC) 10 MG/ML IV SOLN
INTRAVENOUS | Status: DC | PRN
  Administered 2013-08-18: 40 mg via INTRAVENOUS

## 2013-08-18 MED ORDER — MEPERIDINE HCL 25 MG/ML IJ SOLN: 6.2500 mg | INTRAMUSCULAR | Status: DC | PRN

## 2013-08-18 MED ORDER — SUCCINYLCHOLINE CHLORIDE 20 MG/ML IJ SOLN
INTRAMUSCULAR | Status: DC | PRN
  Administered 2013-08-18: 100 mg via INTRAVENOUS

## 2013-08-18 MED ORDER — FENTANYL CITRATE 0.05 MG/ML IJ SOLN
INTRAMUSCULAR | Status: DC | PRN
  Administered 2013-08-18 (×4): 50 ug via INTRAVENOUS

## 2013-08-18 MED ORDER — PROPOFOL 10 MG/ML IV BOLUS
INTRAVENOUS | Status: DC | PRN
  Administered 2013-08-18: 120 mg via INTRAVENOUS
  Administered 2013-08-18: 30 mg via INTRAVENOUS

## 2013-08-18 MED ORDER — LACTATED RINGERS IV SOLN
INTRAVENOUS | Status: DC
  Administered 2013-08-18 (×2): via INTRAVENOUS

## 2013-08-18 MED ORDER — OXYCODONE HCL 5 MG/5ML PO SOLN
5.0000 mg | Freq: Once | ORAL | Status: DC | PRN
Start: 2013-08-18 — End: 2013-08-18

## 2013-08-18 MED ORDER — CEFAZOLIN SODIUM-DEXTROSE 2-3 GM-% IV SOLR
2.0000 g | INTRAVENOUS | Status: AC
Start: 2013-08-18 — End: 2013-08-18
  Administered 2013-08-18: 2 g via INTRAVENOUS

## 2013-08-18 MED ORDER — PROMETHAZINE HCL 25 MG/ML IJ SOLN: 6.2500 mg | INTRAMUSCULAR | Status: DC | PRN

## 2013-08-18 MED ORDER — EPINEPHRINE HCL 1 MG/ML IJ SOLN
INTRAMUSCULAR | Status: AC
Start: 2013-08-18 — End: 2013-08-18
  Filled 2013-08-18: qty 1

## 2013-08-18 MED ORDER — FENTANYL CITRATE 0.05 MG/ML IJ SOLN: 25.0000 ug | INTRAMUSCULAR | Status: DC | PRN

## 2013-08-18 SURGICAL SUPPLY — 32 items
BALLN PULM 15 16.5 18X75 (BALLOONS) ×2
BALLN PULMONARY 10-12 (MISCELLANEOUS) IMPLANT
BALLN PULMONARY 8-10 OD75 (MISCELLANEOUS) IMPLANT
BALLOON PULM 15 16.5 18X75 (BALLOONS) IMPLANT
BANDAGE EYE OVAL (MISCELLANEOUS) ×4 IMPLANT
CANISTER SUCT 1200ML W/VALVE (MISCELLANEOUS) ×2 IMPLANT
DEPRESSOR TONGUE BLADE STERILE (MISCELLANEOUS) ×2 IMPLANT
FILTER 7/8 IN (FILTER) IMPLANT
GLOVE ECLIPSE 7.5 STRL STRAW (GLOVE) ×2 IMPLANT
GLOVE SURG SS PI 7.0 STRL IVOR (GLOVE) ×2 IMPLANT
GOWN STRL REUS W/ TWL LRG LVL3 (GOWN DISPOSABLE) IMPLANT
GOWN STRL REUS W/ TWL XL LVL3 (GOWN DISPOSABLE) IMPLANT
GOWN STRL REUS W/TWL LRG LVL3 (GOWN DISPOSABLE) ×2
GOWN STRL REUS W/TWL XL LVL3 (GOWN DISPOSABLE)
GUARD TEETH (MISCELLANEOUS) ×2 IMPLANT
MARKER SKIN DUAL TIP RULER LAB (MISCELLANEOUS) IMPLANT
NDL SPNL 22GX7 QUINCKE BK (NEEDLE) IMPLANT
NEEDLE SPNL 22GX7 QUINCKE BK (NEEDLE) IMPLANT
NS IRRIG 1000ML POUR BTL (IV SOLUTION) ×2 IMPLANT
PATTIES SURGICAL .5 X3 (DISPOSABLE) ×2 IMPLANT
REDUCTION FITTING 1/4 IN (FILTER) IMPLANT
SHEET MEDIUM DRAPE 40X70 STRL (DRAPES) ×2 IMPLANT
SLEEVE SCD COMPRESS KNEE MED (MISCELLANEOUS) IMPLANT
SOLUTION BUTLER CLEAR DIP (MISCELLANEOUS) IMPLANT
SPONGE GAUZE 4X4 12PLY STER LF (GAUZE/BANDAGES/DRESSINGS) ×4 IMPLANT
SURGILUBE 2OZ TUBE FLIPTOP (MISCELLANEOUS) IMPLANT
SYR 5ML LL (SYRINGE) IMPLANT
SYR CONTROL 10ML LL (SYRINGE) ×2 IMPLANT
SYR INFLATE BILIARY GAUGE (MISCELLANEOUS) ×1 IMPLANT
SYR TB 1ML LL NO SAFETY (SYRINGE) IMPLANT
TOWEL OR 17X24 6PK STRL BLUE (TOWEL DISPOSABLE) ×2 IMPLANT
TUBE CONNECTING 20X1/4 (TUBING) ×2 IMPLANT

## 2013-08-18 NOTE — Anesthesia Postprocedure Evaluation (Signed)
  Anesthesia Post-op Note  Patient: Victoria Mccormick  Procedure(s) Performed: Procedure(s): MICROLARYNGOSCOPY WITH LASER AND BALLOON DILATION (N/A)  Patient Location: PACU  Anesthesia Type:General  Level of Consciousness: awake, alert , oriented and patient cooperative  Airway and Oxygen Therapy: Patient Spontanous Breathing  Post-op Pain: mild  Post-op Assessment: Post-op Vital signs reviewed, Patient's Cardiovascular Status Stable, Respiratory Function Stable, Patent Airway, No signs of Nausea or vomiting, Adequate PO intake and Pain level controlled  Post-op Vital Signs: Reviewed and stable  Last Vitals:  Filed Vitals:   08/18/13 1015  BP: 146/72  Pulse: 60  Temp:   Resp: 11    Complications: No apparent anesthesia complications

## 2013-08-18 NOTE — Interval H&P Note (Signed)
History and Physical Interval Note:  08/18/2013 8:06 AM  Victoria Mccormick  has presented today for surgery, with the diagnosis of subglottic stenosis  The various methods of treatment have been discussed with the patient and family. After consideration of risks, benefits and other options for treatment, the patient has consented to  Procedure(s): MICROLARYNGOSCOPY WITH LASER AND BALLOON DILATION (N/A) as a surgical intervention .  The patient's history has been reviewed, patient examined, no change in status, stable for surgery.  I have reviewed the patient's chart and labs.  Questions were answered to the patient's satisfaction.     Victoria Mccormick

## 2013-08-18 NOTE — Discharge Instructions (Signed)

## 2013-08-18 NOTE — Transfer of Care (Signed)
Immediate Anesthesia Transfer of Care Note  Patient: Victoria Mccormick  Procedure(s) Performed: Procedure(s): MICROLARYNGOSCOPY WITH LASER AND BALLOON DILATION (N/A)  Patient Location: PACU  Anesthesia Type:General  Level of Consciousness: awake, sedated and patient cooperative  Airway & Oxygen Therapy: Patient Spontanous Breathing and aerosol face mask  Post-op Assessment: Report given to PACU RN and Post -op Vital signs reviewed and stable  Post vital signs: Reviewed and stable  Complications: No apparent anesthesia complications

## 2013-08-18 NOTE — Anesthesia Procedure Notes (Addendum)
Performed by: Lyndee Leo   Date/Time: 08/18/2013 8:52 AM Performed by: Lyndee Leo Pre-anesthesia Checklist: Patient identified, Emergency Drugs available, Suction available, Patient being monitored and Timeout performed Patient Re-evaluated:Patient Re-evaluated prior to inductionOxygen Delivery Method: Circle system utilized Preoxygenation: Pre-oxygenation with 100% oxygen Intubation Type: IV induction and Cricoid Pressure applied Ventilation: Mask ventilation without difficulty Laryngoscope Size: Mac and 3 Grade View: Grade IV Tube type: Oral Number of attempts: 4 Airway Equipment and Method: Video-laryngoscopy Placement Confirmation: ETT inserted through vocal cords under direct vision,  positive ETCO2 and breath sounds checked- equal and bilateral Secured at: 20 cm Dental Injury: Teeth and Oropharynx as per pre-operative assessment  Difficulty Due To: Difficulty was anticipated Future Recommendations: Recommend- induction with short-acting agent, and alternative techniques readily available

## 2013-08-18 NOTE — Op Note (Signed)
OPERATIVE REPORT  DATE OF SURGERY: 08/18/2013  PATIENT:  Sheffield Slider,  63 y.o. female  PRE-OPERATIVE DIAGNOSIS:  subglottic stenosis  POST-OPERATIVE DIAGNOSIS:  subglottic stenosis  PROCEDURE:  Procedure(s): MICROLARYNGOSCOPY WITH LASER AND BALLOON DILATION  SURGEON:  Beckie Salts, MD  ASSISTANTS: none  ANESTHESIA:   General   EBL:  5 ml  DRAINS: none  LOCAL MEDICATIONS USED:  None  SPECIMEN:  none  COUNTS:  Correct  PROCEDURE DETAILS: The patient was taken to the operating room and placed on the operating table in the supine position. Following induction of general endotracheal anesthesia, using a #5 ETT, the table was turned 90 degrees and the patient was draped in a standard fashion. Saline soaked eye pads and facial towels were used for laser protection. Maxillary and mandibular tooth protectors were used. A Jako laryngoscope was used to visualize the larynx. The ETT was removed and replaced through the scope. Using apneic technique, the ETT was removed and replaced several times. The operating microscope was used and the CO2 laser on a setting of 2 Watts continuous power to make several radial cuts through the stenotic area. The CRE Pulmonary balloon dilator was used 3 times, twice at 3 ATM ( 15 mm) and once at 4.5 ATM (16.5 cm). A 7 ETT was then placed without difficulty. The scope was removed. The patient was awakened, extubated and transferred to PACU in stable condition.    PATIENT DISPOSITION:  To PACU, stable

## 2013-08-18 NOTE — Anesthesia Preprocedure Evaluation (Addendum)
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 on medications and Pt. on home beta blockers - anginaRhythm:Regular Rate:Normal     Neuro/Psych negative neurological ROS     GI/Hepatic Neg liver ROS, GERD-  Controlled,  Endo/Other  diabetes (glu 154), Oral Hypoglycemic Agents  Renal/GU negative Renal ROS     Musculoskeletal   Abdominal   Peds  Hematology negative hematology ROS (+)   Anesthesia Other Findings   Reproductive/Obstetrics                          Anesthesia Physical Anesthesia Plan  ASA: III  Anesthesia Plan: General   Post-op Pain Management:    Induction: Intravenous  Airway Management Planned: Oral ETT  Additional Equipment:   Intra-op Plan:   Post-operative Plan: Extubation in OR  Informed Consent: I have reviewed the patients History and Physical, chart, labs and discussed the procedure including the risks, benefits and alternatives for the proposed anesthesia with the patient or authorized representative who has indicated his/her understanding and acceptance.   Dental advisory given  Plan Discussed with: CRNA and Surgeon  Anesthesia Plan Comments: (Plan routine monitors, GETA)        Anesthesia Quick Evaluation

## 2013-08-19 NOTE — Addendum Note (Signed)
Addendum created 08/19/13 9163 by Tawni Millers, CRNA   Modules edited: Charges VN

## 2013-08-20 ENCOUNTER — Encounter (HOSPITAL_BASED_OUTPATIENT_CLINIC_OR_DEPARTMENT_OTHER): Payer: Self-pay | Admitting: Otolaryngology

## 2013-12-22 ENCOUNTER — Other Ambulatory Visit: Payer: Self-pay

## 2013-12-22 DIAGNOSIS — Z1239 Encounter for other screening for malignant neoplasm of breast: Secondary | ICD-10-CM

## 2014-01-05 ENCOUNTER — Ambulatory Visit
Admission: RE | Admit: 2014-01-05 | Discharge: 2014-01-05 | Disposition: A | Discharge status: Home / Self Care | Payer: BC Managed Care – PPO | Source: Ambulatory Visit

## 2014-01-05 DIAGNOSIS — Z1239 Encounter for other screening for malignant neoplasm of breast: Secondary | ICD-10-CM

## 2014-08-19 ENCOUNTER — Encounter (HOSPITAL_BASED_OUTPATIENT_CLINIC_OR_DEPARTMENT_OTHER): Payer: Self-pay | Admitting: *Deleted

## 2014-08-21 ENCOUNTER — Other Ambulatory Visit: Payer: Self-pay

## 2014-08-21 ENCOUNTER — Encounter (HOSPITAL_BASED_OUTPATIENT_CLINIC_OR_DEPARTMENT_OTHER)
Admission: RE | Admit: 2014-08-21 | Discharge: 2014-08-21 | Disposition: A | Discharge status: Home / Self Care | Payer: 59 | Source: Ambulatory Visit | Attending: Otolaryngology | Admitting: Otolaryngology

## 2014-08-21 DIAGNOSIS — Z01818 Encounter for other preprocedural examination: Secondary | ICD-10-CM | POA: Insufficient documentation

## 2014-08-21 DIAGNOSIS — J386 Stenosis of larynx: Secondary | ICD-10-CM | POA: Diagnosis not present

## 2014-08-21 LAB — BASIC METABOLIC PANEL
ANION GAP: 10 (ref 5–15)
BUN: 19 mg/dL (ref 6–20)
CHLORIDE: 101 mmol/L (ref 101–111)
CO2: 27 mmol/L (ref 22–32)
Calcium: 9.3 mg/dL (ref 8.9–10.3)
Creatinine, Ser: 0.82 mg/dL (ref 0.44–1.00)
GFR calc Af Amer: >60 mL/min (ref >60)
GFR calc non Af Amer: >60 mL/min (ref >60)
Glucose, Bld: 177 mg/dL — ABNORMAL HIGH (ref 65–99)
Potassium: 4.5 mmol/L (ref 3.5–5.1)
Sodium: 138 mmol/L (ref 135–145)

## 2014-08-25 ENCOUNTER — Ambulatory Visit: Payer: Self-pay | Admitting: Otolaryngology

## 2014-08-25 NOTE — H&P (Signed)
  Assessment  Subglottic stenosis (478.74) (J38.6). Discussed  Having some trouble with noisy breathing again. She has been doing pretty well since last procedure which was almost a year ago. She's ready for new procedure which is scheduled in June. On exam, she has obvious inspiratory stridor. She's in no distress. Oral cavity and pharynx are clear. No palpable neck masses. Proceed with laser laryngoscopy and balloon dilation in early June. Followup otherwise p.r.n. Reason For Visit  Victoria Mccormick presents for preoperative evaluation for surgery. Allergies  No Known Drug Allergies. Current Meds  Atenolol TABS;; RPT Triamterene-HCTZ CAPS;; RPT Lisinopril TABS;; RPT MetFORMIN HCl TABS;; RPT Multivitamins TABS;; RPT Vytorin TABS;; RPT Onglyza TABS;; RPT. Active Problems  History of diabetes mellitus   (V12.29) (Z86.39) Subglottic stenosis   (478.74) (J38.6). [place]  Cesarean Section Throat Surgery; microlaryngoscopy with dilation of subglottic stenosis Throat Surgery [phone]; dilation subglottic stenosis. Family Hx  Family history of cardiac disorder: Father (V84.49) (Z57.49) Stroke syndrome: Mother (I63.9). Personal Hx  Never smoker No alcohol use No caffeine use Non-smoker (V49.89) (Z78.9). Vital Signs   Recorded by [facility] on 21 Jul 2014 03:09 PM BP:124/70,  Height: 5 ft 7 in, Weight: 180 lb , BMI: 28.2 kg/m2,  BMI Calculated: 28.19 ,  BSA Calculated: 1.93. Coun/Edu  Risks, benefits, indications and alternatives of the procedure were discussed and understood. Signature  Electronically signed by : [name]  M.D.; 07/21/2014 3:23 PM EST.

## 2014-08-27 ENCOUNTER — Ambulatory Visit (HOSPITAL_BASED_OUTPATIENT_CLINIC_OR_DEPARTMENT_OTHER)
Admission: RE | Admit: 2014-08-27 | Discharge: 2014-08-27 | Disposition: A | Discharge status: Home / Self Care | Payer: 59 | Source: Ambulatory Visit | Attending: Otolaryngology | Admitting: Otolaryngology

## 2014-08-27 ENCOUNTER — Encounter (HOSPITAL_BASED_OUTPATIENT_CLINIC_OR_DEPARTMENT_OTHER)
Admission: RE | Disposition: A | Discharge status: Home / Self Care | Payer: Self-pay | Source: Ambulatory Visit | Attending: Otolaryngology

## 2014-08-27 ENCOUNTER — Ambulatory Visit (HOSPITAL_BASED_OUTPATIENT_CLINIC_OR_DEPARTMENT_OTHER): Payer: 59 | Admitting: Anesthesiology

## 2014-08-27 ENCOUNTER — Encounter (HOSPITAL_BASED_OUTPATIENT_CLINIC_OR_DEPARTMENT_OTHER): Payer: Self-pay

## 2014-08-27 DIAGNOSIS — E119 Type 2 diabetes mellitus without complications: Secondary | ICD-10-CM | POA: Diagnosis not present

## 2014-08-27 DIAGNOSIS — K219 Gastro-esophageal reflux disease without esophagitis: Secondary | ICD-10-CM | POA: Insufficient documentation

## 2014-08-27 DIAGNOSIS — I10 Essential (primary) hypertension: Secondary | ICD-10-CM | POA: Diagnosis not present

## 2014-08-27 DIAGNOSIS — J386 Stenosis of larynx: Secondary | ICD-10-CM | POA: Diagnosis not present

## 2014-08-27 DIAGNOSIS — Z79899 Other long term (current) drug therapy: Secondary | ICD-10-CM | POA: Diagnosis not present

## 2014-08-27 HISTORY — PX: MICROLARYNGOSCOPY WITH LASER AND BALLOON DILATION: SHX5973

## 2014-08-27 HISTORY — DX: Stenosis of larynx: J38.6

## 2014-08-27 LAB — GLUCOSE, CAPILLARY
Glucose-Capillary: 155 mg/dL — ABNORMAL HIGH (ref 65–99)
Glucose-Capillary: 193 mg/dL — ABNORMAL HIGH (ref 65–99)

## 2014-08-27 LAB — POCT HEMOGLOBIN-HEMACUE: Hemoglobin: 13.5 g/dL (ref 12.0–15.0)

## 2014-08-27 SURGERY — MICROLARYNGOSCOPY WITH LASER AND BALLOON DILATION
Anesthesia: General | Site: Mouth

## 2014-08-27 MED ORDER — EPINEPHRINE HCL 1 MG/ML IJ SOLN
INTRAMUSCULAR | Status: DC | PRN
  Administered 2014-08-27: 1 mg via ENDOTRACHEOPULMONARY

## 2014-08-27 MED ORDER — PROPOFOL 10 MG/ML IV BOLUS
INTRAVENOUS | Status: DC | PRN
  Administered 2014-08-27: 200 mg via INTRAVENOUS

## 2014-08-27 MED ORDER — SUCCINYLCHOLINE CHLORIDE 20 MG/ML IJ SOLN
INTRAMUSCULAR | Status: DC | PRN
  Administered 2014-08-27: 100 mg via INTRAVENOUS

## 2014-08-27 MED ORDER — LACTATED RINGERS IV SOLN
INTRAVENOUS | Status: DC
  Administered 2014-08-27 (×2): via INTRAVENOUS

## 2014-08-27 MED ORDER — FENTANYL CITRATE (PF) 100 MCG/2ML IJ SOLN: 25.0000 ug | INTRAMUSCULAR | Status: DC | PRN

## 2014-08-27 MED ORDER — OXYCODONE HCL 5 MG/5ML PO SOLN: 5.0000 mg | Freq: Once | ORAL | Status: DC | PRN

## 2014-08-27 MED ORDER — CEPHALEXIN 500 MG PO CAPS: 500.0000 mg | ORAL_CAPSULE | Freq: Three times a day (TID) | ORAL | Status: DC

## 2014-08-27 MED ORDER — CEFAZOLIN SODIUM-DEXTROSE 2-3 GM-% IV SOLR
INTRAVENOUS | Status: AC
  Filled 2014-08-27: qty 50

## 2014-08-27 MED ORDER — CEFAZOLIN SODIUM-DEXTROSE 2-3 GM-% IV SOLR: 2.0000 g | INTRAVENOUS | Status: DC

## 2014-08-27 MED ORDER — FENTANYL CITRATE (PF) 100 MCG/2ML IJ SOLN
50.0000 ug | INTRAMUSCULAR | Status: DC | PRN
  Administered 2014-08-27: 100 ug via INTRAVENOUS
  Administered 2014-08-27 (×2): 50 ug via INTRAVENOUS

## 2014-08-27 MED ORDER — METHYLENE BLUE 1 % INJ SOLN
INTRAMUSCULAR | Status: AC
  Filled 2014-08-27: qty 10

## 2014-08-27 MED ORDER — GLYCOPYRROLATE 0.2 MG/ML IJ SOLN: 0.2000 mg | Freq: Once | INTRAMUSCULAR | Status: DC | PRN

## 2014-08-27 MED ORDER — ONDANSETRON HCL 4 MG/2ML IJ SOLN: 4.0000 mg | Freq: Four times a day (QID) | INTRAMUSCULAR | Status: DC | PRN

## 2014-08-27 MED ORDER — MIDAZOLAM HCL 2 MG/2ML IJ SOLN
1.0000 mg | INTRAMUSCULAR | Status: DC | PRN
  Administered 2014-08-27: 2 mg via INTRAVENOUS

## 2014-08-27 MED ORDER — MIDAZOLAM HCL 2 MG/2ML IJ SOLN
INTRAMUSCULAR | Status: AC
  Filled 2014-08-27: qty 2

## 2014-08-27 MED ORDER — DEXAMETHASONE SODIUM PHOSPHATE 4 MG/ML IJ SOLN
INTRAMUSCULAR | Status: DC | PRN
  Administered 2014-08-27: 10 mg via INTRAVENOUS

## 2014-08-27 MED ORDER — OXYCODONE HCL 5 MG PO TABS: 5.0000 mg | ORAL_TABLET | Freq: Once | ORAL | Status: DC | PRN

## 2014-08-27 MED ORDER — LIDOCAINE-EPINEPHRINE 1 %-1:100000 IJ SOLN
INTRAMUSCULAR | Status: AC
  Filled 2014-08-27: qty 1

## 2014-08-27 MED ORDER — FENTANYL CITRATE (PF) 100 MCG/2ML IJ SOLN
INTRAMUSCULAR | Status: AC
  Filled 2014-08-27: qty 4

## 2014-08-27 MED ORDER — ONDANSETRON HCL 4 MG/2ML IJ SOLN
INTRAMUSCULAR | Status: DC | PRN
  Administered 2014-08-27: 4 mg via INTRAVENOUS

## 2014-08-27 MED ORDER — EPINEPHRINE HCL 1 MG/ML IJ SOLN
INTRAMUSCULAR | Status: AC
  Filled 2014-08-27: qty 1

## 2014-08-27 SURGICAL SUPPLY — 41 items
BALLN PULM 15 16.5 18 X 75CM (BALLOONS)
BALLN PULM 15 16.5 18X75 (BALLOONS)
BALLN PULMONARY 10 12MM (MISCELLANEOUS) ×1
BALLN PULMONARY 10-12 (MISCELLANEOUS) ×2 IMPLANT
BALLN PULMONARY 8 10 OD75CM (MISCELLANEOUS)
BALLN PULMONARY 8-10 OD75 (MISCELLANEOUS) IMPLANT
BALLOON PULM 15 16.5 18X75 (BALLOONS) IMPLANT
BANDAGE EYE OVAL (MISCELLANEOUS) ×6 IMPLANT
CANISTER SUCT 1200ML W/VALVE (MISCELLANEOUS) ×3 IMPLANT
DEPRESSOR TONGUE BLADE STERILE (MISCELLANEOUS) ×6 IMPLANT
FILTER 7/8 IN (FILTER) IMPLANT
GLOVE BIO SURGEON STRL SZ 6.5 (GLOVE) ×1 IMPLANT
GLOVE BIO SURGEONS STRL SZ 6.5 (GLOVE) ×1
GLOVE BIOGEL PI IND STRL 7.0 (GLOVE) IMPLANT
GLOVE BIOGEL PI INDICATOR 7.0 (GLOVE) ×2
GLOVE ECLIPSE 7.5 STRL STRAW (GLOVE) ×3 IMPLANT
GOWN STRL REUS W/ TWL LRG LVL3 (GOWN DISPOSABLE) IMPLANT
GOWN STRL REUS W/ TWL XL LVL3 (GOWN DISPOSABLE) IMPLANT
GOWN STRL REUS W/TWL LRG LVL3 (GOWN DISPOSABLE) ×3
GOWN STRL REUS W/TWL XL LVL3 (GOWN DISPOSABLE) ×3
GUARD TEETH (MISCELLANEOUS) ×2 IMPLANT
MARKER SKIN DUAL TIP RULER LAB (MISCELLANEOUS) ×3 IMPLANT
NDL SAFETY ECLIPSE 18X1.5 (NEEDLE) IMPLANT
NDL SPNL 22GX7 QUINCKE BK (NEEDLE) IMPLANT
NEEDLE HYPO 18GX1.5 SHARP (NEEDLE) ×3
NEEDLE SPNL 22GX7 QUINCKE BK (NEEDLE) IMPLANT
NS IRRIG 1000ML POUR BTL (IV SOLUTION) ×3 IMPLANT
PATTIES SURGICAL .5 X3 (DISPOSABLE) ×3 IMPLANT
REDUCTION FITTING 1/4 IN (FILTER) IMPLANT
SHEET MEDIUM DRAPE 40X70 STRL (DRAPES) ×3 IMPLANT
SLEEVE SCD COMPRESS KNEE MED (MISCELLANEOUS) ×2 IMPLANT
SOLUTION BUTLER CLEAR DIP (MISCELLANEOUS) IMPLANT
SPONGE GAUZE 4X4 12PLY STER LF (GAUZE/BANDAGES/DRESSINGS) ×6 IMPLANT
SURGILUBE 2OZ TUBE FLIPTOP (MISCELLANEOUS) ×2 IMPLANT
SYR 5ML LL (SYRINGE) IMPLANT
SYR CONTROL 10ML LL (SYRINGE) ×2 IMPLANT
SYR INFLATE BILIARY GAUGE (MISCELLANEOUS) ×2 IMPLANT
SYR TB 1ML LL NO SAFETY (SYRINGE) IMPLANT
TOWEL OR 17X24 6PK STRL BLUE (TOWEL DISPOSABLE) ×3 IMPLANT
TUBE CONNECTING 20'X1/4 (TUBING) ×2
TUBE CONNECTING 20X1/4 (TUBING) ×4 IMPLANT

## 2014-08-27 NOTE — Anesthesia Procedure Notes (Addendum)
Procedure Name: Intubation Date/Time: 08/27/2014 7:40 AM Performed by: Lyndee Leo Pre-anesthesia Checklist: Patient identified, Emergency Drugs available, Suction available and Patient being monitored Patient Re-evaluated:Patient Re-evaluated prior to inductionOxygen Delivery Method: Circle System Utilized Preoxygenation: Pre-oxygenation with 100% oxygen Intubation Type: IV induction Ventilation: Mask ventilation without difficulty Laryngoscope Size: Glidescope Grade View: Grade III Tube type: Oral Tube size: 5.0 mm Number of attempts: 1 Airway Equipment and Method: Stylet and Oral airway Placement Confirmation: ETT inserted through vocal cords under direct vision,  positive ETCO2 and breath sounds checked- equal and bilateral Secured at: 22 cm Tube secured with: Tape Dental Injury: Teeth and Oropharynx as per pre-operative assessment  Difficulty Due To: Difficulty was anticipated and Difficult Airway- due to limited oral opening Future Recommendations: Recommend- induction with short-acting agent, and alternative techniques readily available   Procedure Name: Intubation Date/Time: 08/27/2014 8:33 AM Performed by: Lyndee Leo Pre-anesthesia Checklist: Patient identified, Emergency Drugs available, Suction available and Patient being monitored Patient Re-evaluated:Patient Re-evaluated prior to inductionOxygen Delivery Method: Circle System Utilized Preoxygenation: Pre-oxygenation with 100% oxygen Ventilation: Mask ventilation without difficulty Laryngoscope Size: Glidescope Grade View: Grade III Tube type: Oral Tube size: 7.0 mm Number of attempts: 1 Airway Equipment and Method: Stylet and Oral airway Placement Confirmation: ETT inserted through vocal cords under direct vision,  positive ETCO2 and breath sounds checked- equal and bilateral Secured at: 22 cm Tube secured with: Tape Dental Injury: Teeth and Oropharynx as per pre-operative assessment  Comments: OET changed  from 5.0 to 7.0 per order of surgeon. Intubated per A. Hoderne per glidescope. Tight fit.

## 2014-08-27 NOTE — Anesthesia Preprocedure Evaluation (Signed)
Anesthesia Evaluation  Patient identified by MRN, date of birth, ID band Patient awake    Reviewed: Allergy & Precautions, NPO status , Patient's Chart, lab work & pertinent test results  Airway Mallampati: III   Neck ROM: full    Dental   Pulmonary neg pulmonary ROS,  breath sounds clear to auscultation        Cardiovascular hypertension, Rhythm:regular Rate:Normal     Neuro/Psych    GI/Hepatic GERD-  ,  Endo/Other  diabetes, Type 2  Renal/GU      Musculoskeletal   Abdominal   Peds  Hematology   Anesthesia Other Findings   Reproductive/Obstetrics                             Anesthesia Physical Anesthesia Plan  ASA: II  Anesthesia Plan: General   Post-op Pain Management:    Induction: Intravenous  Airway Management Planned: Oral ETT and Video Laryngoscope Planned  Additional Equipment:   Intra-op Plan:   Post-operative Plan: Extubation in OR  Informed Consent: I have reviewed the patients History and Physical, chart, labs and discussed the procedure including the risks, benefits and alternatives for the proposed anesthesia with the patient or authorized representative who has indicated his/her understanding and acceptance.     Plan Discussed with: CRNA, Anesthesiologist and Surgeon  Anesthesia Plan Comments:         Anesthesia Quick Evaluation

## 2014-08-27 NOTE — Interval H&P Note (Signed)
History and Physical Interval Note:  08/27/2014 7:18 AM  Victoria Mccormick  has presented today for surgery, with the diagnosis of SUBGLOTTIC STENOSIS   The various methods of treatment have been discussed with the patient and family. After consideration of risks, benefits and other options for treatment, the patient has consented to  Procedure(s): MICROLARYNGOSCOPY WITH LASER AND BALLOON DILATION (N/A) as a surgical intervention .  The patient's history has been reviewed, patient examined, no change in status, stable for surgery.  I have reviewed the patient's chart and labs.  Questions were answered to the patient's satisfaction.     Darlen Gledhill

## 2014-08-27 NOTE — Discharge Instructions (Signed)

## 2014-08-27 NOTE — Op Note (Signed)
OPERATIVE REPORT  DATE OF SURGERY: 08/27/2014  PATIENT:  Victoria Mccormick,  64 y.o. female  PRE-OPERATIVE DIAGNOSIS:  SUBGLOTTIC STENOSIS   POST-OPERATIVE DIAGNOSIS:  SUBGLOTTIC STENOSIS   PROCEDURE:  Procedure(s): MICROLARYNGOSCOPY WITH LASER AND BALLOON DILATION  SURGEON:  Beckie Salts, MD  ASSISTANTS: none  ANESTHESIA:   General   EBL:  5 ml  DRAINS: none  LOCAL MEDICATIONS USED:  None  SPECIMEN:  none  COUNTS:  Correct  PROCEDURE DETAILS: The patient was taken to the operating room and placed on the operating table in the supine position. Following induction of general endotracheal anesthesia, using a #5 ETT, the table was turned 90 degrees and the patient was draped in a standard fashion. Saline soaked eye pads and facial towels were used for laser protection. Maxillary and mandibular tooth protectors were used. An anterior commisure laryngoscope was used to visualize the larynx. The ETT was removed and replaced through the scope. Using apneic technique, the ETT was removed and replaced several times. The operating microscope was used and the CO2 laser on a setting of 3 Watts continuous power to make several radial cuts through the stenotic area. The CRE Pulmonary balloon dilator was used 2 times, once at 5 ATM ( 80m) and once at 8 ATM (12 cm). A 7 ETT was then placed without difficulty. The scope was removed. The patient was awakened, extubated and transferred to PACU in stable condition.    PATIENT DISPOSITION:  To PACU, stable

## 2014-08-27 NOTE — Transfer of Care (Signed)
Immediate Anesthesia Transfer of Care Note  Patient: Victoria Mccormick  Procedure(s) Performed: Procedure(s): MICROLARYNGOSCOPY WITH LASER AND BALLOON DILATION (N/A)  Patient Location: PACU  Anesthesia Type:General  Level of Consciousness: awake, sedated and patient cooperative  Airway & Oxygen Therapy: Patient Spontanous Breathing and aerosol face mask  Post-op Assessment: Report given to RN and Post -op Vital signs reviewed and stable  Post vital signs: Reviewed and stable  Last Vitals:  Filed Vitals:   08/27/14 0635  BP: 136/78  Pulse: 59  Temp: 36.8 C  Resp: 18    Complications: No apparent anesthesia complications

## 2014-08-27 NOTE — H&P (View-Only) (Signed)
  Assessment  Subglottic stenosis (478.74) (J38.6). Discussed  Having some trouble with noisy breathing again. She has been doing pretty well since last procedure which was almost a year ago. She's ready for new procedure which is scheduled in June. On exam, she has obvious inspiratory stridor. She's in no distress. Oral cavity and pharynx are clear. No palpable neck masses. Proceed with laser laryngoscopy and balloon dilation in early June. Followup otherwise p.r.n. Reason For Visit  Victoria Mccormick presents for preoperative evaluation for surgery. Allergies  No Known Drug Allergies. Current Meds  Atenolol TABS;; RPT Triamterene-HCTZ CAPS;; RPT Lisinopril TABS;; RPT MetFORMIN HCl TABS;; RPT Multivitamins TABS;; RPT Vytorin TABS;; RPT Onglyza TABS;; RPT. Active Problems  History of diabetes mellitus   (V12.29) (Z86.39) Subglottic stenosis   (478.74) (J38.6). [place]  Cesarean Section Throat Surgery; microlaryngoscopy with dilation of subglottic stenosis Throat Surgery [phone]; dilation subglottic stenosis. Family Hx  Family history of cardiac disorder: Father (V50.49) (Z67.49) Stroke syndrome: Mother (I63.9). Personal Hx  Never smoker No alcohol use No caffeine use Non-smoker (V49.89) (Z78.9). Vital Signs   Recorded by [facility] on 21 Jul 2014 03:09 PM BP:124/70,  Height: 5 ft 7 in, Weight: 180 lb , BMI: 28.2 kg/m2,  BMI Calculated: 28.19 ,  BSA Calculated: 1.93. Coun/Edu  Risks, benefits, indications and alternatives of the procedure were discussed and understood. Signature  Electronically signed by : [name]  M.D.; 07/21/2014 3:23 PM EST.

## 2014-08-27 NOTE — Anesthesia Postprocedure Evaluation (Signed)
Anesthesia Post Note  Patient: Victoria Mccormick  Procedure(s) Performed: Procedure(s) (LRB): MICROLARYNGOSCOPY WITH LASER AND BALLOON DILATION (N/A)  Anesthesia type: General  Patient location: PACU  Post pain: Pain level controlled and Adequate analgesia  Post assessment: Post-op Vital signs reviewed, Patient's Cardiovascular Status Stable, Respiratory Function Stable, Patent Airway and Pain level controlled  Last Vitals:  Filed Vitals:   08/27/14 1000  BP: 149/67  Pulse: 63  Temp: 36.6 C  Resp: 16    Post vital signs: Reviewed and stable  Level of consciousness: awake, alert  and oriented  Complications: No apparent anesthesia complications

## 2014-08-28 ENCOUNTER — Encounter (HOSPITAL_BASED_OUTPATIENT_CLINIC_OR_DEPARTMENT_OTHER): Payer: Self-pay | Admitting: Otolaryngology

## 2015-01-25 ENCOUNTER — Other Ambulatory Visit: Payer: Self-pay

## 2015-01-25 DIAGNOSIS — Z1231 Encounter for screening mammogram for malignant neoplasm of breast: Secondary | ICD-10-CM

## 2015-02-10 ENCOUNTER — Ambulatory Visit
Admission: RE | Admit: 2015-02-10 | Discharge: 2015-02-10 | Disposition: A | Discharge status: Home / Self Care | Payer: 59 | Source: Ambulatory Visit

## 2015-02-10 DIAGNOSIS — Z1231 Encounter for screening mammogram for malignant neoplasm of breast: Secondary | ICD-10-CM

## 2015-07-08 ENCOUNTER — Encounter (HOSPITAL_BASED_OUTPATIENT_CLINIC_OR_DEPARTMENT_OTHER): Payer: Self-pay | Admitting: *Deleted

## 2015-07-08 ENCOUNTER — Ambulatory Visit: Payer: Self-pay | Admitting: Otolaryngology

## 2015-07-13 ENCOUNTER — Encounter (HOSPITAL_BASED_OUTPATIENT_CLINIC_OR_DEPARTMENT_OTHER)
Admission: RE | Admit: 2015-07-13 | Discharge: 2015-07-13 | Disposition: A | Discharge status: Home / Self Care | Payer: Self-pay | Source: Ambulatory Visit | Attending: Otolaryngology | Admitting: Otolaryngology

## 2015-07-13 DIAGNOSIS — Z7984 Long term (current) use of oral hypoglycemic drugs: Secondary | ICD-10-CM | POA: Diagnosis not present

## 2015-07-13 DIAGNOSIS — I10 Essential (primary) hypertension: Secondary | ICD-10-CM | POA: Diagnosis not present

## 2015-07-13 DIAGNOSIS — K219 Gastro-esophageal reflux disease without esophagitis: Secondary | ICD-10-CM | POA: Diagnosis not present

## 2015-07-13 DIAGNOSIS — E119 Type 2 diabetes mellitus without complications: Secondary | ICD-10-CM | POA: Diagnosis not present

## 2015-07-13 DIAGNOSIS — J386 Stenosis of larynx: Secondary | ICD-10-CM | POA: Diagnosis not present

## 2015-07-13 DIAGNOSIS — Z79899 Other long term (current) drug therapy: Secondary | ICD-10-CM | POA: Diagnosis not present

## 2015-07-13 DIAGNOSIS — E785 Hyperlipidemia, unspecified: Secondary | ICD-10-CM | POA: Diagnosis not present

## 2015-07-13 LAB — BASIC METABOLIC PANEL
ANION GAP: 9 (ref 5–15)
BUN: 20 mg/dL (ref 6–20)
CALCIUM: 9.3 mg/dL (ref 8.9–10.3)
CO2: 29 mmol/L (ref 22–32)
CREATININE: 0.91 mg/dL (ref 0.44–1.00)
Chloride: 101 mmol/L (ref 101–111)
GFR calc Af Amer: >60 mL/min (ref >60)
Glucose, Bld: 164 mg/dL — ABNORMAL HIGH (ref 65–99)
Potassium: 4.6 mmol/L (ref 3.5–5.1)
Sodium: 139 mmol/L (ref 135–145)

## 2015-07-13 NOTE — H&P (Signed)
  Victoria Mccormick is an 64 y.o. female.   Chief Complaint: Stridor   HPI: History idiopathic subglotttic stenosis, reaquiring periodic dilation, having stridor again.  Past Medical History  Diagnosis Date  . Hypertension   . Diabetes mellitus   . GERD (gastroesophageal reflux disease)   . Hyperlipemia   . Wears glasses     reading  . Subglottic stenosis     Past Surgical History  Procedure Laterality Date  . Direct laryngoscopy      glottic stenosis dilated-multiple times-2003to -2013 #8  . Cesarean section      x2  . Microlaryngoscopy with laser and balloon dilation N/A 08/15/2012    Procedure: MICROLARYNGOSCOPY WITH LASER AND BALLOON DILATION;  Surgeon: [name], MD;  Location: [place];  Service: ENT;  Laterality: N/A;  Tracheal Dilation  . Microlaryngoscopy with laser and balloon dilation N/A 01/31/2013    Procedure: MICROLARYNGOSCOPY WITH LASER AND BALLOON DILATION;  Surgeon: [name], MD;  Location: [place];  Service: ENT;  Laterality: N/A;  . Microlaryngoscopy with laser and balloon dilation N/A 08/18/2013    Procedure: MICROLARYNGOSCOPY WITH LASER AND BALLOON DILATION;  Surgeon: [name], MD;  Location: [place];  Service: ENT;  Laterality: N/A;  . Microlaryngoscopy with laser and balloon dilation N/A 08/27/2014    Procedure: MICROLARYNGOSCOPY WITH LASER AND BALLOON DILATION;  Surgeon: [name], MD;  Location: [place];  Service: ENT;  Laterality: N/A;    History reviewed. No pertinent family history. Social History:  reports that she has never smoked. She does not have any smokeless tobacco history on file. She reports that she drinks alcohol. She reports that she does not use illicit drugs.  Allergies: No Known Allergies  No prescriptions prior to admission    No results found for this or any previous visit (from the past 48 hour(s)). No results found.  ROS: otherwise negative  Height  5\' 7"  (1.702 m), weight 85.276 kg (188 lb).  PHYSICAL EXAM: Overall appearance:  Healthy appearing, in no distress, biphasic stridor. Head:  Normocephalic, atraumatic. Ears: External auditory canals are clear; tympanic membranes are intact and the middle ears are free of any effusion. Nose: External nose is healthy in appearance. Internal nasal exam free of any lesions or obstruction. Oral Cavity/pharynx:  There are no mucosal lesions or masses identified. Hypopharynx/Larynx: no signs of any mucosal lesions or masses identified. Vocal cords move normally. Neuro:  No identifiable neurologic deficits. Neck: No palpable neck masses.  Studies Reviewed: none    Assessment/Plan Subglottic stenosis. Proceed with laser laryngoscopy and balloon dilation.  Kareli Hossain 07/13/2015, 9:22 AM

## 2015-07-15 ENCOUNTER — Ambulatory Visit (HOSPITAL_BASED_OUTPATIENT_CLINIC_OR_DEPARTMENT_OTHER): Payer: BLUE CROSS/BLUE SHIELD | Admitting: Anesthesiology

## 2015-07-15 ENCOUNTER — Encounter (HOSPITAL_BASED_OUTPATIENT_CLINIC_OR_DEPARTMENT_OTHER)
Admission: RE | Disposition: A | Discharge status: Home / Self Care | Payer: Self-pay | Source: Ambulatory Visit | Attending: Otolaryngology

## 2015-07-15 ENCOUNTER — Encounter (HOSPITAL_BASED_OUTPATIENT_CLINIC_OR_DEPARTMENT_OTHER): Payer: Self-pay

## 2015-07-15 ENCOUNTER — Ambulatory Visit (HOSPITAL_BASED_OUTPATIENT_CLINIC_OR_DEPARTMENT_OTHER)
Admission: RE | Admit: 2015-07-15 | Discharge: 2015-07-15 | Disposition: A | Discharge status: Home / Self Care | Payer: BLUE CROSS/BLUE SHIELD | Source: Ambulatory Visit | Attending: Otolaryngology | Admitting: Otolaryngology

## 2015-07-15 DIAGNOSIS — I10 Essential (primary) hypertension: Secondary | ICD-10-CM | POA: Insufficient documentation

## 2015-07-15 DIAGNOSIS — Z79899 Other long term (current) drug therapy: Secondary | ICD-10-CM | POA: Insufficient documentation

## 2015-07-15 DIAGNOSIS — J386 Stenosis of larynx: Secondary | ICD-10-CM | POA: Diagnosis not present

## 2015-07-15 DIAGNOSIS — E119 Type 2 diabetes mellitus without complications: Secondary | ICD-10-CM | POA: Insufficient documentation

## 2015-07-15 DIAGNOSIS — K219 Gastro-esophageal reflux disease without esophagitis: Secondary | ICD-10-CM | POA: Insufficient documentation

## 2015-07-15 DIAGNOSIS — Z7984 Long term (current) use of oral hypoglycemic drugs: Secondary | ICD-10-CM | POA: Insufficient documentation

## 2015-07-15 DIAGNOSIS — E785 Hyperlipidemia, unspecified: Secondary | ICD-10-CM | POA: Insufficient documentation

## 2015-07-15 HISTORY — PX: MICROLARYNGOSCOPY WITH LASER AND BALLOON DILATION: SHX5973

## 2015-07-15 LAB — GLUCOSE, CAPILLARY
GLUCOSE-CAPILLARY: 169 mg/dL — AB (ref 65–99)
GLUCOSE-CAPILLARY: 146 mg/dL — AB (ref 65–99)

## 2015-07-15 SURGERY — MICROLARYNGOSCOPY WITH LASER AND BALLOON DILATION
Anesthesia: General

## 2015-07-15 MED ORDER — SCOPOLAMINE 1 MG/3DAYS TD PT72: 1.0000 | MEDICATED_PATCH | Freq: Once | TRANSDERMAL | Status: DC | PRN

## 2015-07-15 MED ORDER — SUCCINYLCHOLINE CHLORIDE 20 MG/ML IJ SOLN
INTRAMUSCULAR | Status: DC | PRN
  Administered 2015-07-15: 100 mg via INTRAVENOUS

## 2015-07-15 MED ORDER — DEXAMETHASONE SODIUM PHOSPHATE 10 MG/ML IJ SOLN
INTRAMUSCULAR | Status: AC
  Filled 2015-07-15: qty 1

## 2015-07-15 MED ORDER — SUCCINYLCHOLINE CHLORIDE 20 MG/ML IJ SOLN
INTRAMUSCULAR | Status: AC
  Filled 2015-07-15: qty 1

## 2015-07-15 MED ORDER — SUGAMMADEX SODIUM 200 MG/2ML IV SOLN
INTRAVENOUS | Status: DC | PRN
Start: 2015-07-15 — End: 2015-07-15
  Administered 2015-07-15: 200 mg via INTRAVENOUS

## 2015-07-15 MED ORDER — EPINEPHRINE HCL 1 MG/ML IJ SOLN
INTRAMUSCULAR | Status: AC
  Filled 2015-07-15: qty 1

## 2015-07-15 MED ORDER — MEPERIDINE HCL 25 MG/ML IJ SOLN: 6.2500 mg | INTRAMUSCULAR | Status: DC | PRN

## 2015-07-15 MED ORDER — PROPOFOL 10 MG/ML IV BOLUS
INTRAVENOUS | Status: DC | PRN
  Administered 2015-07-15: 20 mg via INTRAVENOUS
  Administered 2015-07-15: 50 mg via INTRAVENOUS
  Administered 2015-07-15: 20 mg via INTRAVENOUS
  Administered 2015-07-15: 150 mg via INTRAVENOUS

## 2015-07-15 MED ORDER — FENTANYL CITRATE (PF) 100 MCG/2ML IJ SOLN: 25.0000 ug | INTRAMUSCULAR | Status: DC | PRN

## 2015-07-15 MED ORDER — DEXAMETHASONE SODIUM PHOSPHATE 4 MG/ML IJ SOLN
INTRAMUSCULAR | Status: DC | PRN
  Administered 2015-07-15: 10 mg via INTRAVENOUS

## 2015-07-15 MED ORDER — ONDANSETRON HCL 4 MG/2ML IJ SOLN
INTRAMUSCULAR | Status: AC
  Filled 2015-07-15: qty 2

## 2015-07-15 MED ORDER — FENTANYL CITRATE (PF) 100 MCG/2ML IJ SOLN
INTRAMUSCULAR | Status: AC
  Filled 2015-07-15: qty 2

## 2015-07-15 MED ORDER — LIDOCAINE-EPINEPHRINE (PF) 1 %-1:200000 IJ SOLN
INTRAMUSCULAR | Status: AC
  Filled 2015-07-15: qty 30

## 2015-07-15 MED ORDER — GLYCOPYRROLATE 0.2 MG/ML IJ SOLN: 0.2000 mg | Freq: Once | INTRAMUSCULAR | Status: DC | PRN

## 2015-07-15 MED ORDER — LACTATED RINGERS IV SOLN: INTRAVENOUS | Status: DC

## 2015-07-15 MED ORDER — MIDAZOLAM HCL 2 MG/2ML IJ SOLN
INTRAMUSCULAR | Status: AC
  Filled 2015-07-15: qty 2

## 2015-07-15 MED ORDER — ONDANSETRON HCL 4 MG/2ML IJ SOLN
INTRAMUSCULAR | Status: DC | PRN
  Administered 2015-07-15: 4 mg via INTRAVENOUS

## 2015-07-15 MED ORDER — LIDOCAINE HCL (CARDIAC) 20 MG/ML IV SOLN
INTRAVENOUS | Status: AC
  Filled 2015-07-15: qty 5

## 2015-07-15 MED ORDER — LACTATED RINGERS IV SOLN
INTRAVENOUS | Status: DC
  Administered 2015-07-15 (×2): via INTRAVENOUS

## 2015-07-15 MED ORDER — LIDOCAINE HCL (CARDIAC) 20 MG/ML IV SOLN
INTRAVENOUS | Status: DC | PRN
  Administered 2015-07-15: 60 mg via INTRAVENOUS

## 2015-07-15 MED ORDER — PROPOFOL 500 MG/50ML IV EMUL
INTRAVENOUS | Status: AC
  Filled 2015-07-15: qty 50

## 2015-07-15 MED ORDER — FENTANYL CITRATE (PF) 100 MCG/2ML IJ SOLN
50.0000 ug | INTRAMUSCULAR | Status: DC | PRN
  Administered 2015-07-15: 100 ug via INTRAVENOUS

## 2015-07-15 MED ORDER — ROCURONIUM 10MG/ML (10ML) SYRINGE FOR MEDFUSION PUMP - OPTIME
INTRAVENOUS | Status: DC | PRN
  Administered 2015-07-15: 20 mg via INTRAVENOUS

## 2015-07-15 MED ORDER — AMOXICILLIN 500 MG PO CAPS: 500.0000 mg | ORAL_CAPSULE | Freq: Three times a day (TID) | ORAL | Status: DC

## 2015-07-15 MED ORDER — MIDAZOLAM HCL 2 MG/2ML IJ SOLN
1.0000 mg | INTRAMUSCULAR | Status: DC | PRN
  Administered 2015-07-15: 2 mg via INTRAVENOUS

## 2015-07-15 MED ORDER — METOCLOPRAMIDE HCL 5 MG/ML IJ SOLN: 10.0000 mg | Freq: Once | INTRAMUSCULAR | Status: DC | PRN

## 2015-07-15 SURGICAL SUPPLY — 37 items
BALLN PULM 15 16.5 18 X 75CM (BALLOONS)
BALLN PULM 15 16.5 18X75 (BALLOONS)
BALLN PULMONARY 10 12MM (MISCELLANEOUS) ×1
BALLN PULMONARY 10-12 (MISCELLANEOUS) ×1 IMPLANT
BALLN PULMONARY 8 10 OD75CM (MISCELLANEOUS)
BALLN PULMONARY 8-10 OD75 (MISCELLANEOUS) IMPLANT
BALLOON PULM 15 16.5 18X75 (BALLOONS) IMPLANT
BANDAGE EYE OVAL (MISCELLANEOUS) ×6 IMPLANT
CANISTER SUCT 1200ML W/VALVE (MISCELLANEOUS) ×3 IMPLANT
DEPRESSOR TONGUE BLADE STERILE (MISCELLANEOUS) ×3 IMPLANT
FILTER 7/8 IN (FILTER) ×2 IMPLANT
GLOVE BIOGEL PI IND STRL 7.0 (GLOVE) IMPLANT
GLOVE BIOGEL PI INDICATOR 7.0 (GLOVE) ×2
GLOVE ECLIPSE 7.5 STRL STRAW (GLOVE) ×3 IMPLANT
GOWN STRL REUS W/ TWL LRG LVL3 (GOWN DISPOSABLE) IMPLANT
GOWN STRL REUS W/ TWL XL LVL3 (GOWN DISPOSABLE) IMPLANT
GOWN STRL REUS W/TWL LRG LVL3 (GOWN DISPOSABLE) ×3
GOWN STRL REUS W/TWL XL LVL3 (GOWN DISPOSABLE)
GUARD TEETH (MISCELLANEOUS) ×2 IMPLANT
MARKER SKIN DUAL TIP RULER LAB (MISCELLANEOUS) ×2 IMPLANT
NDL SPNL 22GX7 QUINCKE BK (NEEDLE) IMPLANT
NEEDLE SPNL 22GX7 QUINCKE BK (NEEDLE) IMPLANT
NS IRRIG 1000ML POUR BTL (IV SOLUTION) ×3 IMPLANT
PATTIES SURGICAL .5 X3 (DISPOSABLE) ×3 IMPLANT
REDUCTION FITTING 1/4 IN (FILTER) ×2 IMPLANT
SHEET MEDIUM DRAPE 40X70 STRL (DRAPES) ×3 IMPLANT
SLEEVE SCD COMPRESS KNEE MED (MISCELLANEOUS) ×2 IMPLANT
SOLUTION BUTLER CLEAR DIP (MISCELLANEOUS) IMPLANT
SPONGE GAUZE 4X4 12PLY STER LF (GAUZE/BANDAGES/DRESSINGS) ×6 IMPLANT
SURGILUBE 2OZ TUBE FLIPTOP (MISCELLANEOUS) ×2 IMPLANT
SYR 5ML LL (SYRINGE) ×2 IMPLANT
SYR CONTROL 10ML LL (SYRINGE) ×3 IMPLANT
SYR INFLATE BILIARY GAUGE (MISCELLANEOUS) ×2 IMPLANT
SYR TB 1ML LL NO SAFETY (SYRINGE) IMPLANT
TOWEL OR 17X24 6PK STRL BLUE (TOWEL DISPOSABLE) ×3 IMPLANT
TUBE CONNECTING 20'X1/4 (TUBING) ×2
TUBE CONNECTING 20X1/4 (TUBING) ×4 IMPLANT

## 2015-07-15 NOTE — Anesthesia Preprocedure Evaluation (Signed)
Anesthesia Evaluation  Patient identified by MRN, date of birth, ID band Patient awake    Reviewed: Allergy & Precautions, NPO status , Patient's Chart, lab work & pertinent test results  Airway Mallampati: III   Neck ROM: full    Dental   Pulmonary neg pulmonary ROS,  breath sounds clear to auscultation        Cardiovascular hypertension, Rhythm:regular Rate:Normal     Neuro/Psych    GI/Hepatic GERD-  ,  Endo/Other  diabetes, Type 2  Renal/GU      Musculoskeletal   Abdominal   Peds  Hematology   Anesthesia Other Findings   Reproductive/Obstetrics                             Anesthesia Physical Anesthesia Plan  ASA: II  Anesthesia Plan: General   Post-op Pain Management:    Induction: Intravenous  Airway Management Planned: Oral ETT and Video Laryngoscope Planned  Additional Equipment:   Intra-op Plan:   Post-operative Plan: Extubation in OR  Informed Consent: I have reviewed the patients History and Physical, chart, labs and discussed the procedure including the risks, benefits and alternatives for the proposed anesthesia with the patient or authorized representative who has indicated his/her understanding and acceptance.     Plan Discussed with: CRNA, Anesthesiologist and Surgeon  Anesthesia Plan Comments:         Anesthesia Quick Evaluation  

## 2015-07-15 NOTE — Anesthesia Procedure Notes (Signed)
Procedure Name: Intubation Date/Time: 07/15/2015 7:40 AM Performed by: Maryella Shivers Pre-anesthesia Checklist: Patient identified, Emergency Drugs available, Suction available and Patient being monitored Patient Re-evaluated:Patient Re-evaluated prior to inductionOxygen Delivery Method: Circle System Utilized Preoxygenation: Pre-oxygenation with 100% oxygen Intubation Type: IV induction Ventilation: Mask ventilation without difficulty Grade View: Grade I Tube type: Oral Tube size: 5.0 mm Number of attempts: 1 Airway Equipment and Method: Stylet,  Oral airway and Video-laryngoscopy Placement Confirmation: positive ETCO2 and breath sounds checked- equal and bilateral Secured at: 21 cm Tube secured with: Tape Dental Injury: Teeth and Oropharynx as per pre-operative assessment  Difficulty Due To: Difficulty was anticipated

## 2015-07-15 NOTE — Interval H&P Note (Signed)
History and Physical Interval Note:  07/15/2015 7:22 AM  Victoria Mccormick  has presented today for surgery, with the diagnosis of subglottic stenosis  The various methods of treatment have been discussed with the patient and family. After consideration of risks, benefits and other options for treatment, the patient has consented to  Procedure(s): MICROLARYNGOSCOPY WITH LASER AND BALLOON DILATION (N/A) as a surgical intervention .  The patient's history has been reviewed, patient examined, no change in status, stable for surgery.  I have reviewed the patient's chart and labs.  Questions were answered to the patient's satisfaction.     Fintan Grater

## 2015-07-15 NOTE — Discharge Instructions (Signed)

## 2015-07-15 NOTE — Transfer of Care (Signed)
Immediate Anesthesia Transfer of Care Note  Patient: Victoria Mccormick  Procedure(s) Performed: Procedure(s): MICROLARYNGOSCOPY WITH LASER AND BALLOON DILATION (N/A)  Patient Location: PACU  Anesthesia Type:General  Level of Consciousness: sedated  Airway & Oxygen Therapy: Patient Spontanous Breathing and Patient connected to face mask oxygen  Post-op Assessment: Report given to RN and Post -op Vital signs reviewed and stable  Post vital signs: Reviewed and stable  Last Vitals:  Filed Vitals:   07/15/15 0636  BP: 154/71  Pulse: 67  Temp: 36.8 C  Resp: 18    Last Pain: There were no vitals filed for this visit.       Complications: No apparent anesthesia complications

## 2015-07-15 NOTE — Anesthesia Postprocedure Evaluation (Signed)
Anesthesia Post Note  Patient: Christiann P Nilsen  Procedure(s) Performed: Procedure(s) (LRB): MICROLARYNGOSCOPY WITH LASER AND BALLOON DILATION (N/A)  Patient location during evaluation: PACU Anesthesia Type: General Level of consciousness: awake and alert Pain management: pain level controlled Vital Signs Assessment: post-procedure vital signs reviewed and stable Respiratory status: spontaneous breathing, nonlabored ventilation, respiratory function stable and patient connected to nasal cannula oxygen Cardiovascular status: blood pressure returned to baseline and stable Postop Assessment: no signs of nausea or vomiting Anesthetic complications: no    Last Vitals:  Filed Vitals:   07/15/15 0900 07/15/15 0943  BP: 139/81 152/77  Pulse: 64 63  Temp:  36.5 C  Resp: 16 18    Last Pain:  Filed Vitals:   07/15/15 0945  PainSc: 0-No pain                 Montez Hageman

## 2015-07-15 NOTE — Op Note (Signed)
OPERATIVE REPORT  DATE OF SURGERY: 07/15/2015  PATIENT:  Victoria Mccormick,  65 y.o. female  PRE-OPERATIVE DIAGNOSIS:  subglottic stenosis  POST-OPERATIVE DIAGNOSIS:  subglottic stenosis  PROCEDURE:  Procedure(s): MICROLARYNGOSCOPY WITH LASER AND BALLOON DILATION  SURGEON:  Beckie Salts, MD  ASSISTANTS: None  ANESTHESIA:   General   EBL:  5 ml  DRAINS: None  LOCAL MEDICATIONS USED:  None  SPECIMEN:  none  COUNTS:  Correct  PROCEDURE DETAILS: The patient was taken to the operating room and placed on the operating table in the supine position. Following induction of general endotracheal anesthesia, initially with a #5 endotracheal tube, the table was turned 90 and the patient was draped in a standard fashion. A maxillary tooth guard was used throughout the case. Saline soaked towels were used for laser protection around the face. Eye pads were used as well. A Jako laryngoscope was entered into the oral cavity and used to visualize the larynx. This was attached to the [place] stand with the suspension apparatus. The endotracheal tube was removed and was replaced through the laryngoscope. The tube was removed and replaced as needed throughout the case. The operating microscope was brought into view. The CO2 laser attachment was set up. The subglottic area was inspected. Secretions were suctioned. A shelf of tissue in the subglottic space approximately 2 cm below the cords was identified. Radial cuts were created using the CO2 laser at a setting of 3 W continuous power. Cuts were made at 11:00, 12:00, 6:00. These were the most significant stenotic areas. The balloon was then used up to and at 8 atm pressure corresponding to 12 mm dilation. This was done twice at slightly different levels to assure adequate dilation. A #7 endotracheal tube was then replaced. The endoscope was removed. The patient was then awakened extubated and transferred to recovery in stable condition.    PATIENT  DISPOSITION:  To PACU, stable

## 2015-07-16 ENCOUNTER — Encounter (HOSPITAL_BASED_OUTPATIENT_CLINIC_OR_DEPARTMENT_OTHER): Payer: Self-pay | Admitting: Otolaryngology

## 2015-12-19 DIAGNOSIS — T8859XA Other complications of anesthesia, initial encounter: Secondary | ICD-10-CM

## 2015-12-19 HISTORY — PX: KNEE ARTHROSCOPY: SHX127

## 2015-12-19 HISTORY — DX: Other complications of anesthesia, initial encounter: T88.59XA

## 2016-01-18 ENCOUNTER — Ambulatory Visit: Payer: Self-pay | Admitting: Otolaryngology

## 2016-01-18 NOTE — H&P (Signed)
  Victoria Mccormick is an 65 y.o. female.   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 Having stridor and exercise intolerance again.  Past Medical History:  Diagnosis Date  . Diabetes mellitus   . GERD (gastroesophageal reflux disease)   . Hyperlipemia   . Hypertension   . Subglottic stenosis   . Wears glasses    reading    Past Surgical History:  Procedure Laterality Date  . CESAREAN SECTION     x2  . DIRECT LARYNGOSCOPY     glottic stenosis dilated-multiple times-2003to -2013 #8  . MICROLARYNGOSCOPY WITH LASER AND BALLOON DILATION N/A 08/15/2012   Procedure: MICROLARYNGOSCOPY WITH LASER AND BALLOON DILATION;  Surgeon: [name], MD;  Location: [place];  Service: ENT;  Laterality: N/A;  Tracheal Dilation  . MICROLARYNGOSCOPY WITH LASER AND BALLOON DILATION N/A 01/31/2013   Procedure: MICROLARYNGOSCOPY WITH LASER AND BALLOON DILATION;  Surgeon: [name], MD;  Location: [place];  Service: ENT;  Laterality: N/A;  . MICROLARYNGOSCOPY WITH LASER AND BALLOON DILATION N/A 08/18/2013   Procedure: MICROLARYNGOSCOPY WITH LASER AND BALLOON DILATION;  Surgeon: [name], MD;  Location: [place];  Service: ENT;  Laterality: N/A;  . MICROLARYNGOSCOPY WITH LASER AND BALLOON DILATION N/A 08/27/2014   Procedure: MICROLARYNGOSCOPY WITH LASER AND BALLOON DILATION;  Surgeon: [name], MD;  Location: [place];  Service: ENT;  Laterality: N/A;  . MICROLARYNGOSCOPY WITH LASER AND BALLOON DILATION N/A 07/15/2015   Procedure: MICROLARYNGOSCOPY WITH LASER AND BALLOON DILATION;  Surgeon: [name], MD;  Location: [place];  Service: ENT;  Laterality: N/A;    No family history on file. Social History:  reports that she has never smoked. She does not have any smokeless tobacco history on file. She reports that she drinks  alcohol. She reports that she does not use drugs.  Allergies: No Known Allergies   (Not in a hospital admission)  No results found for this or any previous visit (from the past 48 hour(s)). No results found.  ROS: otherwise negative  There were no vitals taken for this visit.  PHYSICAL EXAM: Overall appearance:  Healthy appearing, in no distress. Having biphasic stridor. Head:  Normocephalic, atraumatic. Ears: External auditory canals are clear; tympanic membranes are intact and the middle ears are free of any effusion. Nose: External nose is healthy in appearance. Internal nasal exam free of any lesions or obstruction. Oral Cavity/pharynx:  There are no mucosal lesions or masses identified. Hypopharynx/Larynx: no signs of any mucosal lesions or masses identified. Vocal cords move normally. Neuro:  No identifiable neurologic deficits. Neck: No palpable neck masses.  Studies Reviewed: none    Assessment/Plan Proceed with laser and balloon dilation.  Laura Radilla 01/18/2016, 4:58 PM

## 2016-01-24 ENCOUNTER — Encounter (HOSPITAL_BASED_OUTPATIENT_CLINIC_OR_DEPARTMENT_OTHER): Payer: Self-pay | Admitting: *Deleted

## 2016-01-25 ENCOUNTER — Other Ambulatory Visit: Payer: Self-pay

## 2016-01-25 ENCOUNTER — Encounter (HOSPITAL_BASED_OUTPATIENT_CLINIC_OR_DEPARTMENT_OTHER)
Admission: RE | Admit: 2016-01-25 | Discharge: 2016-01-25 | Disposition: A | Discharge status: Home / Self Care | Payer: BLUE CROSS/BLUE SHIELD | Source: Ambulatory Visit | Attending: Otolaryngology | Admitting: Otolaryngology

## 2016-01-25 DIAGNOSIS — E785 Hyperlipidemia, unspecified: Secondary | ICD-10-CM | POA: Diagnosis not present

## 2016-01-25 DIAGNOSIS — K219 Gastro-esophageal reflux disease without esophagitis: Secondary | ICD-10-CM | POA: Diagnosis not present

## 2016-01-25 DIAGNOSIS — J386 Stenosis of larynx: Secondary | ICD-10-CM | POA: Diagnosis not present

## 2016-01-25 DIAGNOSIS — I10 Essential (primary) hypertension: Secondary | ICD-10-CM | POA: Diagnosis not present

## 2016-01-25 DIAGNOSIS — E119 Type 2 diabetes mellitus without complications: Secondary | ICD-10-CM | POA: Diagnosis not present

## 2016-01-25 DIAGNOSIS — Z7984 Long term (current) use of oral hypoglycemic drugs: Secondary | ICD-10-CM | POA: Diagnosis not present

## 2016-01-25 LAB — BASIC METABOLIC PANEL
Anion gap: 10 (ref 5–15)
BUN: 17 mg/dL (ref 6–20)
CHLORIDE: 101 mmol/L (ref 101–111)
CO2: 27 mmol/L (ref 22–32)
Calcium: 9.6 mg/dL (ref 8.9–10.3)
Creatinine, Ser: 0.91 mg/dL (ref 0.44–1.00)
GFR calc Af Amer: >60 mL/min (ref >60)
GFR calc non Af Amer: >60 mL/min (ref >60)
Glucose, Bld: 120 mg/dL — ABNORMAL HIGH (ref 65–99)
POTASSIUM: 4.4 mmol/L (ref 3.5–5.1)
SODIUM: 138 mmol/L (ref 135–145)

## 2016-01-26 ENCOUNTER — Other Ambulatory Visit: Payer: Self-pay | Admitting: Family Medicine

## 2016-01-26 DIAGNOSIS — Z1231 Encounter for screening mammogram for malignant neoplasm of breast: Secondary | ICD-10-CM

## 2016-01-27 NOTE — Anesthesia Preprocedure Evaluation (Addendum)
Anesthesia Evaluation  Patient identified by MRN, date of birth, ID band Patient awake    Reviewed: Allergy & Precautions, NPO status , Patient's Chart, lab work & pertinent test results, reviewed documented beta blocker date and time   History of Anesthesia Complications (+) history of anesthetic complications ("TMJ issues")  Airway Mallampati: III  TM Distance: <3 FB Neck ROM: Full    Dental  (+) Teeth Intact, Dental Advisory Given   Pulmonary neg pulmonary ROS,    Pulmonary exam normal breath sounds clear to auscultation       Cardiovascular hypertension, Pt. on home beta blockers and Pt. on medications (-) angina(-) CAD, (-) Past MI and (-) CHF Normal cardiovascular exam Rhythm:Regular Rate:Normal     Neuro/Psych negative neurological ROS  negative psych ROS   GI/Hepatic Neg liver ROS, GERD  ,  Endo/Other  diabetes, Type 2, Oral Hypoglycemic Agents  Renal/GU negative Renal ROS     Musculoskeletal negative musculoskeletal ROS (+)   Abdominal   Peds  Hematology negative hematology ROS (+)   Anesthesia Other Findings Day of surgery medications reviewed with the patient.  Subglottic stenosis s/p multiple laser/balloon dilations  Reproductive/Obstetrics                            Anesthesia Physical Anesthesia Plan  ASA: II  Anesthesia Plan: General   Post-op Pain Management:    Induction: Intravenous  Airway Management Planned: Oral ETT and Video Laryngoscope Planned  Additional Equipment:   Intra-op Plan:   Post-operative Plan: Extubation in OR  Informed Consent: I have reviewed the patients History and Physical, chart, labs and discussed the procedure including the risks, benefits and alternatives for the proposed anesthesia with the patient or authorized representative who has indicated his/her understanding and acceptance.   Dental advisory given  Plan Discussed with:  CRNA  Anesthesia Plan Comments: (Risks/benefits of general anesthesia discussed with patient including risk of damage to teeth, lips, gum, and tongue, nausea/vomiting, allergic reactions to medications, and the possibility of heart attack, stroke and death.  All patient questions answered.  Patient wishes to proceed.  5.18mm ETT with glidescope per last intubation for same procedure.)        Anesthesia Quick Evaluation

## 2016-01-28 ENCOUNTER — Ambulatory Visit (HOSPITAL_BASED_OUTPATIENT_CLINIC_OR_DEPARTMENT_OTHER): Payer: BLUE CROSS/BLUE SHIELD | Admitting: Anesthesiology

## 2016-01-28 ENCOUNTER — Encounter (HOSPITAL_BASED_OUTPATIENT_CLINIC_OR_DEPARTMENT_OTHER): Payer: Self-pay | Admitting: Anesthesiology

## 2016-01-28 ENCOUNTER — Encounter (HOSPITAL_BASED_OUTPATIENT_CLINIC_OR_DEPARTMENT_OTHER)
Admission: RE | Disposition: A | Discharge status: Home / Self Care | Payer: Self-pay | Source: Ambulatory Visit | Attending: Otolaryngology

## 2016-01-28 ENCOUNTER — Ambulatory Visit (HOSPITAL_BASED_OUTPATIENT_CLINIC_OR_DEPARTMENT_OTHER)
Admission: RE | Admit: 2016-01-28 | Discharge: 2016-01-28 | Disposition: A | Discharge status: Home / Self Care | Payer: BLUE CROSS/BLUE SHIELD | Source: Ambulatory Visit | Attending: Otolaryngology | Admitting: Otolaryngology

## 2016-01-28 DIAGNOSIS — J386 Stenosis of larynx: Secondary | ICD-10-CM | POA: Diagnosis not present

## 2016-01-28 DIAGNOSIS — Z7984 Long term (current) use of oral hypoglycemic drugs: Secondary | ICD-10-CM | POA: Insufficient documentation

## 2016-01-28 DIAGNOSIS — I10 Essential (primary) hypertension: Secondary | ICD-10-CM | POA: Insufficient documentation

## 2016-01-28 DIAGNOSIS — E785 Hyperlipidemia, unspecified: Secondary | ICD-10-CM | POA: Insufficient documentation

## 2016-01-28 DIAGNOSIS — K219 Gastro-esophageal reflux disease without esophagitis: Secondary | ICD-10-CM | POA: Insufficient documentation

## 2016-01-28 DIAGNOSIS — E119 Type 2 diabetes mellitus without complications: Secondary | ICD-10-CM | POA: Insufficient documentation

## 2016-01-28 HISTORY — DX: Adverse effect of unspecified anesthetic, initial encounter: T41.45XA

## 2016-01-28 HISTORY — PX: MICROLARYNGOSCOPY WITH LASER AND BALLOON DILATION: SHX5973

## 2016-01-28 LAB — GLUCOSE, CAPILLARY
GLUCOSE-CAPILLARY: 154 mg/dL — AB (ref 65–99)
Glucose-Capillary: 148 mg/dL — ABNORMAL HIGH (ref 65–99)

## 2016-01-28 SURGERY — MICROLARYNGOSCOPY WITH LASER AND BALLOON DILATION
Anesthesia: General | Site: Throat

## 2016-01-28 MED ORDER — LIDOCAINE-EPINEPHRINE 1 %-1:100000 IJ SOLN
INTRAMUSCULAR | Status: AC
Start: 2016-01-28 — End: 2016-01-28
  Filled 2016-01-28: qty 1

## 2016-01-28 MED ORDER — MIDAZOLAM HCL 2 MG/2ML IJ SOLN
INTRAMUSCULAR | Status: AC
  Filled 2016-01-28: qty 2

## 2016-01-28 MED ORDER — SCOPOLAMINE 1 MG/3DAYS TD PT72: 1.0000 | MEDICATED_PATCH | Freq: Once | TRANSDERMAL | Status: DC | PRN

## 2016-01-28 MED ORDER — FENTANYL CITRATE (PF) 100 MCG/2ML IJ SOLN: 25.0000 ug | INTRAMUSCULAR | Status: DC | PRN

## 2016-01-28 MED ORDER — FENTANYL CITRATE (PF) 100 MCG/2ML IJ SOLN
50.0000 ug | INTRAMUSCULAR | Status: DC | PRN
  Administered 2016-01-28: 100 ug via INTRAVENOUS

## 2016-01-28 MED ORDER — DEXAMETHASONE SODIUM PHOSPHATE 10 MG/ML IJ SOLN
INTRAMUSCULAR | Status: AC
  Filled 2016-01-28: qty 1

## 2016-01-28 MED ORDER — PROPOFOL 500 MG/50ML IV EMUL
INTRAVENOUS | Status: AC
Start: 2016-01-28 — End: 2016-01-28
  Filled 2016-01-28: qty 50

## 2016-01-28 MED ORDER — METHYLENE BLUE 0.5 % INJ SOLN
INTRAVENOUS | Status: AC
  Filled 2016-01-28: qty 10

## 2016-01-28 MED ORDER — SODIUM CHLORIDE 0.9 % IJ SOLN
INTRAMUSCULAR | Status: AC
  Filled 2016-01-28: qty 20

## 2016-01-28 MED ORDER — LACTATED RINGERS IV SOLN
INTRAVENOUS | Status: DC
  Administered 2016-01-28 (×2): via INTRAVENOUS

## 2016-01-28 MED ORDER — FENTANYL CITRATE (PF) 100 MCG/2ML IJ SOLN
INTRAMUSCULAR | Status: AC
  Filled 2016-01-28: qty 2

## 2016-01-28 MED ORDER — ONDANSETRON HCL 4 MG/2ML IJ SOLN
INTRAMUSCULAR | Status: AC
  Filled 2016-01-28: qty 2

## 2016-01-28 MED ORDER — CEPHALEXIN 500 MG PO CAPS: 500.0000 mg | ORAL_CAPSULE | Freq: Three times a day (TID) | ORAL | 0 refills | Status: DC

## 2016-01-28 MED ORDER — DEXAMETHASONE SODIUM PHOSPHATE 4 MG/ML IJ SOLN
INTRAMUSCULAR | Status: DC | PRN
  Administered 2016-01-28: 10 mg via INTRAVENOUS

## 2016-01-28 MED ORDER — EPINEPHRINE 30 MG/30ML IJ SOLN
INTRAMUSCULAR | Status: AC
  Filled 2016-01-28: qty 1

## 2016-01-28 MED ORDER — SUCCINYLCHOLINE CHLORIDE 20 MG/ML IJ SOLN
INTRAMUSCULAR | Status: DC | PRN
  Administered 2016-01-28: 60 mg via INTRAVENOUS
  Administered 2016-01-28: 100 mg via INTRAVENOUS

## 2016-01-28 MED ORDER — MIDAZOLAM HCL 2 MG/2ML IJ SOLN: 1.0000 mg | INTRAMUSCULAR | Status: DC | PRN

## 2016-01-28 MED ORDER — LIDOCAINE HCL (CARDIAC) 20 MG/ML IV SOLN
INTRAVENOUS | Status: DC | PRN
  Administered 2016-01-28: 100 mg via INTRAVENOUS
  Administered 2016-01-28: 30 mg via INTRAVENOUS

## 2016-01-28 MED ORDER — SODIUM CHLORIDE 0.9 % IJ SOLN
INTRAMUSCULAR | Status: AC
  Filled 2016-01-28: qty 10

## 2016-01-28 MED ORDER — LIDOCAINE 2% (20 MG/ML) 5 ML SYRINGE
INTRAMUSCULAR | Status: AC
  Filled 2016-01-28: qty 5

## 2016-01-28 MED ORDER — PROPOFOL 10 MG/ML IV BOLUS
INTRAVENOUS | Status: DC | PRN
  Administered 2016-01-28: 50 mg via INTRAVENOUS
  Administered 2016-01-28: 150 mg via INTRAVENOUS

## 2016-01-28 MED ORDER — ONDANSETRON HCL 4 MG/2ML IJ SOLN: 4.0000 mg | Freq: Once | INTRAMUSCULAR | Status: DC | PRN

## 2016-01-28 SURGICAL SUPPLY — 39 items
BALLN PULM 15 16.5 18 X 75CM (BALLOONS) ×1
BALLN PULM 15 16.5 18X75 (BALLOONS) ×2
BALLN PULMONARY 10 12MM (MISCELLANEOUS)
BALLN PULMONARY 10-12 (MISCELLANEOUS) IMPLANT
BALLN PULMONARY 8 10 OD75CM (MISCELLANEOUS)
BALLN PULMONARY 8-10 OD75 (MISCELLANEOUS) IMPLANT
BALLOON PULM 15 16.5 18X75 (BALLOONS) IMPLANT
BANDAGE EYE OVAL (MISCELLANEOUS) ×6 IMPLANT
CANISTER SUCT 1200ML W/VALVE (MISCELLANEOUS) ×3 IMPLANT
DEPRESSOR TONGUE BLADE STERILE (MISCELLANEOUS) ×3 IMPLANT
FILTER 7/8 IN (FILTER) IMPLANT
GLOVE BIOGEL PI IND STRL 7.5 (GLOVE) IMPLANT
GLOVE BIOGEL PI INDICATOR 7.5 (GLOVE) ×2
GLOVE ECLIPSE 7.5 STRL STRAW (GLOVE) ×3 IMPLANT
GLOVE SURG SS PI 7.0 STRL IVOR (GLOVE) ×2 IMPLANT
GOWN STRL REUS W/ TWL LRG LVL3 (GOWN DISPOSABLE) IMPLANT
GOWN STRL REUS W/ TWL XL LVL3 (GOWN DISPOSABLE) IMPLANT
GOWN STRL REUS W/TWL LRG LVL3 (GOWN DISPOSABLE) ×3
GOWN STRL REUS W/TWL XL LVL3 (GOWN DISPOSABLE) ×3
GUARD TEETH (MISCELLANEOUS) ×2 IMPLANT
MARKER SKIN DUAL TIP RULER LAB (MISCELLANEOUS) IMPLANT
NDL SPNL 22GX7 QUINCKE BK (NEEDLE) IMPLANT
NEEDLE SPNL 22GX7 QUINCKE BK (NEEDLE) IMPLANT
NS IRRIG 1000ML POUR BTL (IV SOLUTION) ×3 IMPLANT
PATTIES SURGICAL .5 X3 (DISPOSABLE) ×3 IMPLANT
REDUCTION FITTING 1/4 IN (FILTER) IMPLANT
SHEET MEDIUM DRAPE 40X70 STRL (DRAPES) ×3 IMPLANT
SLEEVE SCD COMPRESS KNEE MED (MISCELLANEOUS) IMPLANT
SOLUTION BUTLER CLEAR DIP (MISCELLANEOUS) ×3 IMPLANT
SPONGE GAUZE 4X4 12PLY STER LF (GAUZE/BANDAGES/DRESSINGS) ×6 IMPLANT
SURGILUBE 2OZ TUBE FLIPTOP (MISCELLANEOUS) IMPLANT
SYR 3ML 23GX1 SAFETY (SYRINGE) ×2 IMPLANT
SYR 5ML LL (SYRINGE) IMPLANT
SYR CONTROL 10ML LL (SYRINGE) IMPLANT
SYR INFLATE BILIARY GAUGE (MISCELLANEOUS) ×2 IMPLANT
SYR TB 1ML LL NO SAFETY (SYRINGE) IMPLANT
TOWEL OR 17X24 6PK STRL BLUE (TOWEL DISPOSABLE) ×3 IMPLANT
TUBE CONNECTING 20'X1/4 (TUBING) ×2
TUBE CONNECTING 20X1/4 (TUBING) ×3 IMPLANT

## 2016-01-28 NOTE — Interval H&P Note (Signed)
History and Physical Interval Note:  01/28/2016 7:14 AM  Victoria Mccormick  has presented today for surgery, with the diagnosis of subglottic stenosis  The various methods of treatment have been discussed with the patient and family. After consideration of risks, benefits and other options for treatment, the patient has consented to  Procedure(s): MICROLARYNGOSCOPY WITH LASER AND BALLOON DILATION (N/A) as a surgical intervention .  The patient's history has been reviewed, patient examined, no change in status, stable for surgery.  I have reviewed the patient's chart and labs.  Questions were answered to the patient's satisfaction.     [name]

## 2016-01-28 NOTE — Transfer of Care (Signed)
Immediate Anesthesia Transfer of Care Note  Patient: Victoria Mccormick  Procedure(s) Performed: Procedure(s): MICROLARYNGOSCOPY WITH LASER AND BALLOON DILATION (N/A)  Patient Location: PACU  Anesthesia Type:General  Level of Consciousness: awake and patient cooperative  Airway & Oxygen Therapy: Patient Spontanous Breathing and Patient connected to face mask oxygen  Post-op Assessment: Report given to RN and Post -op Vital signs reviewed and stable  Post vital signs: Reviewed and stable  Last Vitals:  Vitals:   01/28/16 0634  BP: (!) 153/71  Pulse: 70  Resp: 16  Temp: 36.9 C    Last Pain:  Vitals:   01/28/16 0634  TempSrc: Oral  PainSc: 1          Complications: No apparent anesthesia complications

## 2016-01-28 NOTE — Op Note (Signed)
OPERATIVE REPORT  DATE OF SURGERY: 01/28/2016  PATIENT:  Victoria Mccormick,  65 y.o. female  PRE-OPERATIVE DIAGNOSIS:  subglottic stenosis  POST-OPERATIVE DIAGNOSIS:  subglottic stenosis  PROCEDURE:  Procedure(s): MICROLARYNGOSCOPY WITH LASER AND BALLOON DILATION  SURGEON:  Beckie Salts, MD  ASSISTANTS: none  ANESTHESIA:   General   EBL:  5 ml  DRAINS: none  LOCAL MEDICATIONS USED:  None  SPECIMEN:  none  COUNTS:  Correct  PROCEDURE DETAILS: The patient was taken to the operating room and placed on the operating table in the supine position. Following induction of general endotracheal anesthesia using a 5 ETT, the patient was draped in a standard fashion. Saline soaked eye pads were used. A maxillary tooth protector was used.  The Jako laryngoscope was entered into the oral cavity used to visualize the larynx and attached the [place] stand with the suspension apparatus. The endotracheal tube was removed and then intermittently replaced through the laryngoscope. All of the laser and dilation work was done in an apneic technique. The carbon dioxide laser was attached to the operating microscope and used to create multiple radial incisions in the stenotic area in the subglottis using 3 W continuous power.  The CRE Pulmonary Balloon Dilation Catheter was used at 5 ATM , which corresponds to 15 mm , on 3 occasions to dilate the stenotic segment. A rod telescope was used to visualize the area to assure that it was patent. A #7 endotracheal tube was then placed. The laryngoscope was removed. The patient's care was handed over to anesthesia who awakened and extubated without difficulty.    PATIENT DISPOSITION:  To PACU, stable

## 2016-01-28 NOTE — H&P (View-Only) (Signed)
  Victoria Mccormick is an 65 y.o. female.   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 Having stridor and exercise intolerance again.  Past Medical History:  Diagnosis Date  . Diabetes mellitus   . GERD (gastroesophageal reflux disease)   . Hyperlipemia   . Hypertension   . Subglottic stenosis   . Wears glasses    reading    Past Surgical History:  Procedure Laterality Date  . CESAREAN SECTION     x2  . DIRECT LARYNGOSCOPY     glottic stenosis dilated-multiple times-2003to -2013 #8  . MICROLARYNGOSCOPY WITH LASER AND BALLOON DILATION N/A 08/15/2012   Procedure: MICROLARYNGOSCOPY WITH LASER AND BALLOON DILATION;  Surgeon: [name], MD;  Location: [place];  Service: ENT;  Laterality: N/A;  Tracheal Dilation  . MICROLARYNGOSCOPY WITH LASER AND BALLOON DILATION N/A 01/31/2013   Procedure: MICROLARYNGOSCOPY WITH LASER AND BALLOON DILATION;  Surgeon: [name], MD;  Location: [place];  Service: ENT;  Laterality: N/A;  . MICROLARYNGOSCOPY WITH LASER AND BALLOON DILATION N/A 08/18/2013   Procedure: MICROLARYNGOSCOPY WITH LASER AND BALLOON DILATION;  Surgeon: [name], MD;  Location: [place];  Service: ENT;  Laterality: N/A;  . MICROLARYNGOSCOPY WITH LASER AND BALLOON DILATION N/A 08/27/2014   Procedure: MICROLARYNGOSCOPY WITH LASER AND BALLOON DILATION;  Surgeon: [name], MD;  Location: [place];  Service: ENT;  Laterality: N/A;  . MICROLARYNGOSCOPY WITH LASER AND BALLOON DILATION N/A 07/15/2015   Procedure: MICROLARYNGOSCOPY WITH LASER AND BALLOON DILATION;  Surgeon: [name], MD;  Location: [place];  Service: ENT;  Laterality: N/A;    No family history on file. Social History:  reports that she has never smoked. She does not have any smokeless tobacco history on file. She reports that she drinks  alcohol. She reports that she does not use drugs.  Allergies: No Known Allergies   (Not in a hospital admission)  No results found for this or any previous visit (from the past 48 hour(s)). No results found.  ROS: otherwise negative  There were no vitals taken for this visit.  PHYSICAL EXAM: Overall appearance:  Healthy appearing, in no distress. Having biphasic stridor. Head:  Normocephalic, atraumatic. Ears: External auditory canals are clear; tympanic membranes are intact and the middle ears are free of any effusion. Nose: External nose is healthy in appearance. Internal nasal exam free of any lesions or obstruction. Oral Cavity/pharynx:  There are no mucosal lesions or masses identified. Hypopharynx/Larynx: no signs of any mucosal lesions or masses identified. Vocal cords move normally. Neuro:  No identifiable neurologic deficits. Neck: No palpable neck masses.  Studies Reviewed: none    Assessment/Plan Proceed with laser and balloon dilation.  Cassiel Fernandez 01/18/2016, 4:58 PM

## 2016-01-28 NOTE — Discharge Instructions (Signed)

## 2016-01-28 NOTE — Anesthesia Postprocedure Evaluation (Signed)
Anesthesia Post Note  Patient: Victoria Mccormick  Procedure(s) Performed: Procedure(s) (LRB): MICROLARYNGOSCOPY WITH LASER AND BALLOON DILATION (N/A)  Patient location during evaluation: PACU Anesthesia Type: General Level of consciousness: awake and alert Pain management: pain level controlled Vital Signs Assessment: post-procedure vital signs reviewed and stable Respiratory status: spontaneous breathing, nonlabored ventilation, respiratory function stable and patient connected to nasal cannula oxygen Cardiovascular status: blood pressure returned to baseline and stable Postop Assessment: no signs of nausea or vomiting Anesthetic complications: no    Last Vitals:  Vitals:   01/28/16 0915 01/28/16 0945  BP: (!) 155/87 (!) 169/77  Pulse: 65 65  Resp: 13 16  Temp:  36.4 C    Last Pain:  Vitals:   01/28/16 0945  TempSrc:   PainSc: 0-No pain                 Catalina Gravel

## 2016-01-28 NOTE — Anesthesia Procedure Notes (Signed)
Procedure Name: Intubation Date/Time: 01/28/2016 7:42 AM Performed by: Marrianne Mood Pre-anesthesia Checklist: Patient identified, Emergency Drugs available, Suction available, Patient being monitored and Timeout performed Patient Re-evaluated:Patient Re-evaluated prior to inductionOxygen Delivery Method: Circle system utilized Preoxygenation: Pre-oxygenation with 100% oxygen Intubation Type: IV induction Ventilation: Mask ventilation without difficulty Laryngoscope Size: Glidescope, Mac and 3 Grade View: Grade II Tube type: Oral Laser Tube: Cuffed inflated with minimal occlusive pressure - saline Tube size: 6.0 mm Number of attempts: 2 Airway Equipment and Method: Stylet and Oral airway Placement Confirmation: ETT inserted through vocal cords under direct vision,  positive ETCO2 and breath sounds checked- equal and bilateral Tube secured with: Tape Dental Injury: Teeth and Oropharynx as per pre-operative assessment  Difficulty Due To: Difficulty was anticipated, Difficult Airway- due to reduced neck mobility and Difficult Airway- due to limited oral opening

## 2016-01-31 ENCOUNTER — Encounter (HOSPITAL_BASED_OUTPATIENT_CLINIC_OR_DEPARTMENT_OTHER): Payer: Self-pay | Admitting: Otolaryngology

## 2016-02-28 ENCOUNTER — Ambulatory Visit
Admission: RE | Admit: 2016-02-28 | Discharge: 2016-02-28 | Disposition: A | Discharge status: Home / Self Care | Payer: BLUE CROSS/BLUE SHIELD | Source: Ambulatory Visit | Attending: Family Medicine | Admitting: Family Medicine

## 2016-02-28 DIAGNOSIS — Z1231 Encounter for screening mammogram for malignant neoplasm of breast: Secondary | ICD-10-CM

## 2016-07-27 ENCOUNTER — Ambulatory Visit: Payer: Self-pay | Admitting: Otolaryngology

## 2016-07-27 NOTE — H&P (Signed)
Victoria Mccormick is an 66 y.o. female.   Chief Complaint: Subglottic stenosis HPI: Long history of subglottic stenosis, idiopathic. Starting to have trouble breathing again.  Past Medical History:  Diagnosis Date  . 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 Diabetes mellitus   . GERD (gastroesophageal reflux disease)   . Hyperlipemia   . Hypertension   . Subglottic stenosis   . Wears glasses    reading    Past Surgical History:  Procedure Laterality Date  . CESAREAN SECTION     x2  . DIRECT LARYNGOSCOPY     glottic stenosis dilated-multiple times-2003to -2013 #8  . KNEE ARTHROSCOPY Left 12/2015  . MICROLARYNGOSCOPY WITH LASER AND BALLOON DILATION N/A 08/15/2012   Procedure: MICROLARYNGOSCOPY WITH LASER AND BALLOON DILATION;  Surgeon: [name], MD;  Location: [place];  Service: ENT;  Laterality: N/A;  Tracheal Dilation  . MICROLARYNGOSCOPY WITH LASER AND BALLOON DILATION N/A 01/31/2013   Procedure: MICROLARYNGOSCOPY WITH LASER AND BALLOON DILATION;  Surgeon: [name], MD;  Location: [place];  Service: ENT;  Laterality: N/A;  . MICROLARYNGOSCOPY WITH LASER AND BALLOON DILATION N/A 08/18/2013   Procedure: MICROLARYNGOSCOPY WITH LASER AND BALLOON DILATION;  Surgeon: [name], MD;  Location: [place];  Service: ENT;  Laterality: N/A;  . MICROLARYNGOSCOPY WITH LASER AND BALLOON DILATION N/A 08/27/2014   Procedure: MICROLARYNGOSCOPY WITH LASER AND BALLOON DILATION;  Surgeon: [name], MD;  Location: [place];  Service: ENT;  Laterality: N/A;  . MICROLARYNGOSCOPY WITH LASER AND BALLOON DILATION N/A 07/15/2015   Procedure: MICROLARYNGOSCOPY WITH LASER AND BALLOON DILATION;  Surgeon: [name], MD;  Location: [place];  Service: ENT;  Laterality: N/A;  . MICROLARYNGOSCOPY WITH LASER AND BALLOON DILATION N/A 01/28/2016   Procedure: MICROLARYNGOSCOPY  WITH LASER AND BALLOON DILATION;  Surgeon: [name], MD;  Location: [place];  Service: ENT;  Laterality: N/A;    No family history on file. Social History:  reports that she has never smoked. She has never used smokeless tobacco. She reports that she drinks alcohol. She reports that she does not use drugs.  Allergies: No Known Allergies   (Not in a hospital admission)  No results found for this or any previous visit (from the past 48 hour(s)). No results found.  ROS: otherwise negative  There were no vitals taken for this visit.  PHYSICAL EXAM: Overall appearance:  Healthy appearing, in no distress. She does have inspiratory stridor. Head:  Normocephalic, atraumatic. Ears: External ears look healthy. Nose: External nose is healthy in appearance. Internal nasal exam free of any lesions or obstruction. Oral Cavity/pharynx:  There are no mucosal lesions or masses identified. Neuro:  No identifiable neurologic deficits. Neck: No palpable neck masses.  Studies Reviewed: none    Assessment/Plan Chronic subglottic stenosis. Proceed with microlaryngoscopy, laser excision, and balloon dilation.  Victoria Mccormick 07/27/2016, 12:13 PM

## 2016-08-25 ENCOUNTER — Encounter (HOSPITAL_BASED_OUTPATIENT_CLINIC_OR_DEPARTMENT_OTHER): Payer: Self-pay | Admitting: *Deleted

## 2016-08-29 ENCOUNTER — Encounter (HOSPITAL_BASED_OUTPATIENT_CLINIC_OR_DEPARTMENT_OTHER)
Admission: RE | Admit: 2016-08-29 | Discharge: 2016-08-29 | Disposition: A | Discharge status: Home / Self Care | Payer: Medicare Other | Source: Ambulatory Visit | Attending: Otolaryngology | Admitting: Otolaryngology

## 2016-08-29 DIAGNOSIS — I10 Essential (primary) hypertension: Secondary | ICD-10-CM | POA: Diagnosis not present

## 2016-08-29 DIAGNOSIS — J386 Stenosis of larynx: Secondary | ICD-10-CM | POA: Diagnosis present

## 2016-08-29 DIAGNOSIS — E785 Hyperlipidemia, unspecified: Secondary | ICD-10-CM | POA: Diagnosis not present

## 2016-08-29 DIAGNOSIS — Z7984 Long term (current) use of oral hypoglycemic drugs: Secondary | ICD-10-CM | POA: Diagnosis not present

## 2016-08-29 DIAGNOSIS — Z79899 Other long term (current) drug therapy: Secondary | ICD-10-CM | POA: Diagnosis not present

## 2016-08-29 DIAGNOSIS — E119 Type 2 diabetes mellitus without complications: Secondary | ICD-10-CM | POA: Diagnosis not present

## 2016-08-29 DIAGNOSIS — K219 Gastro-esophageal reflux disease without esophagitis: Secondary | ICD-10-CM | POA: Diagnosis not present

## 2016-08-29 LAB — BASIC METABOLIC PANEL
Anion gap: 7 (ref 5–15)
BUN: 17 mg/dL (ref 6–20)
CALCIUM: 9.4 mg/dL (ref 8.9–10.3)
CHLORIDE: 100 mmol/L — AB (ref 101–111)
CO2: 29 mmol/L (ref 22–32)
CREATININE: 0.93 mg/dL (ref 0.44–1.00)
GFR calc Af Amer: >60 mL/min (ref >60)
GFR calc non Af Amer: >60 mL/min (ref >60)
Glucose, Bld: 151 mg/dL — ABNORMAL HIGH (ref 65–99)
Potassium: 3.9 mmol/L (ref 3.5–5.1)
SODIUM: 136 mmol/L (ref 135–145)

## 2016-09-01 ENCOUNTER — Ambulatory Visit (HOSPITAL_BASED_OUTPATIENT_CLINIC_OR_DEPARTMENT_OTHER): Payer: Medicare Other | Admitting: Anesthesiology

## 2016-09-01 ENCOUNTER — Ambulatory Visit (HOSPITAL_BASED_OUTPATIENT_CLINIC_OR_DEPARTMENT_OTHER)
Admission: RE | Admit: 2016-09-01 | Discharge: 2016-09-01 | Disposition: A | Discharge status: Home / Self Care | Payer: Medicare Other | Source: Ambulatory Visit | Attending: Otolaryngology | Admitting: Otolaryngology

## 2016-09-01 ENCOUNTER — Encounter (HOSPITAL_BASED_OUTPATIENT_CLINIC_OR_DEPARTMENT_OTHER): Payer: Self-pay | Admitting: Anesthesiology

## 2016-09-01 ENCOUNTER — Encounter (HOSPITAL_BASED_OUTPATIENT_CLINIC_OR_DEPARTMENT_OTHER)
Admission: RE | Disposition: A | Discharge status: Home / Self Care | Payer: Self-pay | Source: Ambulatory Visit | Attending: Otolaryngology

## 2016-09-01 DIAGNOSIS — E785 Hyperlipidemia, unspecified: Secondary | ICD-10-CM | POA: Diagnosis not present

## 2016-09-01 DIAGNOSIS — I10 Essential (primary) hypertension: Secondary | ICD-10-CM | POA: Diagnosis not present

## 2016-09-01 DIAGNOSIS — Z79899 Other long term (current) drug therapy: Secondary | ICD-10-CM | POA: Insufficient documentation

## 2016-09-01 DIAGNOSIS — E119 Type 2 diabetes mellitus without complications: Secondary | ICD-10-CM | POA: Diagnosis not present

## 2016-09-01 DIAGNOSIS — K219 Gastro-esophageal reflux disease without esophagitis: Secondary | ICD-10-CM | POA: Insufficient documentation

## 2016-09-01 DIAGNOSIS — J386 Stenosis of larynx: Secondary | ICD-10-CM | POA: Insufficient documentation

## 2016-09-01 DIAGNOSIS — Z7984 Long term (current) use of oral hypoglycemic drugs: Secondary | ICD-10-CM | POA: Insufficient documentation

## 2016-09-01 HISTORY — PX: MICROLARYNGOSCOPY WITH DILATION: SHX5971

## 2016-09-01 LAB — GLUCOSE, CAPILLARY
GLUCOSE-CAPILLARY: 152 mg/dL — AB (ref 65–99)
Glucose-Capillary: 120 mg/dL — ABNORMAL HIGH (ref 65–99)

## 2016-09-01 SURGERY — MICROLARYNGOSCOPY WITH DILATION
Anesthesia: General | Site: Mouth

## 2016-09-01 MED ORDER — SODIUM CHLORIDE 0.9 % IJ SOLN
INTRAMUSCULAR | Status: AC
  Filled 2016-09-01: qty 10

## 2016-09-01 MED ORDER — LIDOCAINE HCL (CARDIAC) 20 MG/ML IV SOLN
INTRAVENOUS | Status: DC | PRN
  Administered 2016-09-01: 30 mg via INTRAVENOUS

## 2016-09-01 MED ORDER — FENTANYL CITRATE (PF) 100 MCG/2ML IJ SOLN: 25.0000 ug | INTRAMUSCULAR | Status: DC | PRN

## 2016-09-01 MED ORDER — EPINEPHRINE 30 MG/30ML IJ SOLN
INTRAMUSCULAR | Status: AC
  Filled 2016-09-01: qty 1

## 2016-09-01 MED ORDER — FENTANYL CITRATE (PF) 100 MCG/2ML IJ SOLN
INTRAMUSCULAR | Status: AC
  Filled 2016-09-01: qty 2

## 2016-09-01 MED ORDER — SCOPOLAMINE 1 MG/3DAYS TD PT72: 1.0000 | MEDICATED_PATCH | Freq: Once | TRANSDERMAL | Status: DC | PRN

## 2016-09-01 MED ORDER — PROPOFOL 10 MG/ML IV BOLUS
INTRAVENOUS | Status: DC | PRN
  Administered 2016-09-01 (×2): 100 mg via INTRAVENOUS

## 2016-09-01 MED ORDER — MIDAZOLAM HCL 2 MG/2ML IJ SOLN
INTRAMUSCULAR | Status: AC
  Filled 2016-09-01: qty 2

## 2016-09-01 MED ORDER — DEXAMETHASONE SODIUM PHOSPHATE 4 MG/ML IJ SOLN
INTRAMUSCULAR | Status: DC | PRN
  Administered 2016-09-01: 10 mg via INTRAVENOUS

## 2016-09-01 MED ORDER — LACTATED RINGERS IV SOLN
INTRAVENOUS | Status: DC
  Administered 2016-09-01: 11:00:00 via INTRAVENOUS

## 2016-09-01 MED ORDER — LIDOCAINE-EPINEPHRINE 1 %-1:100000 IJ SOLN
INTRAMUSCULAR | Status: AC
  Filled 2016-09-01: qty 1

## 2016-09-01 MED ORDER — SUCCINYLCHOLINE CHLORIDE 200 MG/10ML IV SOSY
PREFILLED_SYRINGE | INTRAVENOUS | Status: AC
  Filled 2016-09-01: qty 10

## 2016-09-01 MED ORDER — ONDANSETRON HCL 4 MG/2ML IJ SOLN
INTRAMUSCULAR | Status: AC
  Filled 2016-09-01: qty 2

## 2016-09-01 MED ORDER — FENTANYL CITRATE (PF) 100 MCG/2ML IJ SOLN
INTRAMUSCULAR | Status: DC | PRN
Start: 2016-09-01 — End: 2016-09-01
  Administered 2016-09-01: 100 ug via INTRAVENOUS

## 2016-09-01 MED ORDER — PROMETHAZINE HCL 25 MG/ML IJ SOLN: 6.2500 mg | INTRAMUSCULAR | Status: DC | PRN

## 2016-09-01 MED ORDER — LIDOCAINE 2% (20 MG/ML) 5 ML SYRINGE
INTRAMUSCULAR | Status: AC
  Filled 2016-09-01: qty 5

## 2016-09-01 MED ORDER — MEPERIDINE HCL 25 MG/ML IJ SOLN: 6.2500 mg | INTRAMUSCULAR | Status: DC | PRN

## 2016-09-01 MED ORDER — 0.9 % SODIUM CHLORIDE (POUR BTL) OPTIME
TOPICAL | Status: DC | PRN
  Administered 2016-09-01: 1000 mL

## 2016-09-01 MED ORDER — PROPOFOL 10 MG/ML IV BOLUS
INTRAVENOUS | Status: AC
  Filled 2016-09-01: qty 20

## 2016-09-01 MED ORDER — MIDAZOLAM HCL 2 MG/2ML IJ SOLN: 0.5000 mg | Freq: Once | INTRAMUSCULAR | Status: DC | PRN

## 2016-09-01 MED ORDER — FENTANYL CITRATE (PF) 100 MCG/2ML IJ SOLN: 50.0000 ug | INTRAMUSCULAR | Status: DC | PRN

## 2016-09-01 MED ORDER — AMOXICILLIN 500 MG PO CAPS: 500.0000 mg | ORAL_CAPSULE | Freq: Three times a day (TID) | ORAL | 0 refills | Status: AC

## 2016-09-01 MED ORDER — MIDAZOLAM HCL 2 MG/2ML IJ SOLN
1.0000 mg | INTRAMUSCULAR | Status: DC | PRN
Start: 2016-09-01 — End: 2016-09-01

## 2016-09-01 MED ORDER — MIDAZOLAM HCL 5 MG/5ML IJ SOLN
INTRAMUSCULAR | Status: DC | PRN
  Administered 2016-09-01: 2 mg via INTRAVENOUS

## 2016-09-01 MED ORDER — SUCCINYLCHOLINE CHLORIDE 20 MG/ML IJ SOLN
INTRAMUSCULAR | Status: DC | PRN
  Administered 2016-09-01 (×2): 50 mg via INTRAVENOUS

## 2016-09-01 MED ORDER — ONDANSETRON HCL 4 MG/2ML IJ SOLN
INTRAMUSCULAR | Status: DC | PRN
  Administered 2016-09-01: 4 mg via INTRAVENOUS

## 2016-09-01 MED ORDER — DEXAMETHASONE SODIUM PHOSPHATE 10 MG/ML IJ SOLN
INTRAMUSCULAR | Status: AC
  Filled 2016-09-01: qty 1

## 2016-09-01 SURGICAL SUPPLY — 21 items
BALLN PULM 15 16.5 18 X 75CM (BALLOONS) ×1
BALLN PULM 15 16.5 18X75 (BALLOONS) ×3
BALLN PULMONARY 10 12MM (MISCELLANEOUS) ×1
BALLN PULMONARY 10-12 (MISCELLANEOUS) ×2 IMPLANT
BALLOON PULM 15 16.5 18X75 (BALLOONS) ×1 IMPLANT
BANDAGE EYE OVAL (MISCELLANEOUS) ×8 IMPLANT
CANISTER SUCT 1200ML W/VALVE (MISCELLANEOUS) ×4 IMPLANT
DEPRESSOR TONGUE BLADE STERILE (MISCELLANEOUS) ×4 IMPLANT
GAUZE SPONGE 4X4 12PLY STRL LF (GAUZE/BANDAGES/DRESSINGS) ×8 IMPLANT
GLOVE ECLIPSE 7.5 STRL STRAW (GLOVE) ×4 IMPLANT
GOWN STRL REUS W/ TWL LRG LVL3 (GOWN DISPOSABLE) ×1 IMPLANT
GOWN STRL REUS W/TWL LRG LVL3 (GOWN DISPOSABLE) ×4
GUARD TEETH (MISCELLANEOUS) ×3 IMPLANT
NS IRRIG 1000ML POUR BTL (IV SOLUTION) ×4 IMPLANT
PATTIES SURGICAL .5 X3 (DISPOSABLE) ×4 IMPLANT
SHEET MEDIUM DRAPE 40X70 STRL (DRAPES) ×4 IMPLANT
SLEEVE SCD COMPRESS KNEE MED (MISCELLANEOUS) ×4 IMPLANT
SYR INFLATE BILIARY GAUGE (MISCELLANEOUS) ×3 IMPLANT
TOWEL OR 17X24 6PK STRL BLUE (TOWEL DISPOSABLE) ×4 IMPLANT
TUBE CONNECTING 20'X1/4 (TUBING) ×1
TUBE CONNECTING 20X1/4 (TUBING) ×3 IMPLANT

## 2016-09-01 NOTE — Anesthesia Postprocedure Evaluation (Signed)
Anesthesia Post Note  Patient: Tianna P Sloane  Procedure(s) Performed: Procedure(s) (LRB): MICROLARYNGOSCOPY WITH BALLOON DILATION (N/A)     Patient location during evaluation: PACU Anesthesia Type: General Level of consciousness: awake and alert, oriented and patient cooperative Pain management: pain level controlled Vital Signs Assessment: post-procedure vital signs reviewed and stable Respiratory status: spontaneous breathing, nonlabored ventilation and respiratory function stable Cardiovascular status: blood pressure returned to baseline and stable Postop Assessment: no signs of nausea or vomiting Anesthetic complications: no    Last Vitals:  Vitals:   09/01/16 1301 09/01/16 1334  BP: (!) 152/69 (!) 148/76  Pulse: 62 68  Resp: 12 18  Temp: 36.6 C 36.9 C    Last Pain:  Vitals:   09/01/16 1258  TempSrc:   PainSc: 0-No pain                 Liesel Peckenpaugh,E. Blease Capaldi

## 2016-09-01 NOTE — Discharge Instructions (Signed)

## 2016-09-01 NOTE — Anesthesia Preprocedure Evaluation (Addendum)
Anesthesia Evaluation  Patient identified by MRN, date of birth, ID band Patient awake    Reviewed: Allergy & Precautions, NPO status , Patient's Chart, lab work & pertinent test results  History of Anesthesia Complications Negative for: history of anesthetic complications  Airway Mallampati: II  TM Distance: >3 FB Neck ROM: Full    Dental  (+) Dental Advisory Given, Chipped   Pulmonary shortness of breath and with exertion,  H/o subglottic stenosis  Subglottic stenosis/stridor   (-) wheezing + stridor (inspiratory and expiratory)     Cardiovascular hypertension, Pt. on medications and Pt. on home beta blockers  Rhythm:Regular Rate:Normal     Neuro/Psych negative neurological ROS     GI/Hepatic Neg liver ROS, GERD  Medicated and Controlled,  Endo/Other  diabetes, Oral Hypoglycemic Agents  Renal/GU negative Renal ROS     Musculoskeletal   Abdominal   Peds  Hematology negative hematology ROS (+)   Anesthesia Other Findings   Reproductive/Obstetrics                        Anesthesia Physical Anesthesia Plan  ASA: III  Anesthesia Plan: General   Post-op Pain Management:    Induction: Intravenous  PONV Risk Score and Plan: 3 and Ondansetron, Dexamethasone, Propofol and Midazolam  Airway Management Planned: Oral ETT and Video Laryngoscope Planned  Additional Equipment:   Intra-op Plan:   Post-operative Plan: Extubation in OR  Informed Consent: I have reviewed the patients History and Physical, chart, labs and discussed the procedure including the risks, benefits and alternatives for the proposed anesthesia with the patient or authorized representative who has indicated his/her understanding and acceptance.   Dental advisory given  Plan Discussed with: CRNA and Surgeon  Anesthesia Plan Comments: (Plan routine monitors, GETA)        Anesthesia Quick Evaluation

## 2016-09-01 NOTE — Anesthesia Procedure Notes (Signed)
Procedure Name: Intubation Date/Time: 09/01/2016 11:29 AM Performed by: Marrianne Mood Pre-anesthesia Checklist: Patient identified, Emergency Drugs available, Suction available, Patient being monitored and Timeout performed Patient Re-evaluated:Patient Re-evaluated prior to inductionOxygen Delivery Method: Circle system utilized Preoxygenation: Pre-oxygenation with 100% oxygen Intubation Type: IV induction Ventilation: Mask ventilation without difficulty Laryngoscope Size: Glidescope and 2 Grade View: Grade III Tube type: Oral Tube size: 5.0 mm Number of attempts: 1 Airway Equipment and Method: Stylet and Oral airway Placement Confirmation: ETT inserted through vocal cords under direct vision,  positive ETCO2 and breath sounds checked- equal and bilateral Secured at: 19 cm Tube secured with: Tape Dental Injury: Teeth and Oropharynx as per pre-operative assessment  Difficulty Due To: Difficulty was anticipated, Difficult Airway- due to reduced neck mobility and Difficult Airway- due to limited oral opening

## 2016-09-01 NOTE — Op Note (Signed)
09/01/2016  12:11 PM  PATIENT:  Victoria Mccormick  66 y.o. female  PRE-OPERATIVE DIAGNOSIS:  SUBGLOTIC STENOSIS  POST-OPERATIVE DIAGNOSIS:  SUBGLOTIC STENOSIS  PROCEDURE:  Procedure(s): MICROLARYNGOSCOPY WITH BALLOON DILATION  SURGEON:  Surgeon(s): [name], MD  ANESTHESIA:   General  COUNTS:  Correct   DICTATION: The patient was taken to the operating room and placed on the operating table in the supine position. Following induction of general endotracheal anesthesia, the table was turned and the patient was draped in a standard fashion. Initially the patient was intubated with a #5 endotracheal tube and there was difficulty getting it to pass down beyond the stenotic segment. A maxillary tooth protector was used throughout the case. A Jako laryngoscope was used to evaluate the larynx and was secured to the Mayo stand with the suspension apparatus. I was able to remove the tube after my laryngoscope was in place and replace it easily through the laryngoscope. The operating microscope was used as well. 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 I decided to simply do the balloon dilation. The balloon dilation was accomplished 4 times.  CRE Pulmonary Balloon dilators, 12 and 15 mm dilation was accomplished. Each was held for 30 seconds. Following this I was able to place a 6.5 endotracheal tube without difficulty. The scope was removed. The patient was awakened extubated and transferred to recovery in stable condition.     PATIENT DISPOSITION:  To PACA, stable

## 2016-09-01 NOTE — Transfer of Care (Signed)
Immediate Anesthesia Transfer of Care Note  Patient: Victoria Mccormick  Procedure(s) Performed: Procedure(s): MICROLARYNGOSCOPY WITH BALLOON DILATION (N/A)  Patient Location: PACU  Anesthesia Type:General  Level of Consciousness: awake and patient cooperative  Airway & Oxygen Therapy: Patient Spontanous Breathing, Patient connected to face mask and aerosol face mask  Post-op Assessment: Report given to RN and Post -op Vital signs reviewed and stable  Post vital signs: Reviewed and stable  Last Vitals:  Vitals:   09/01/16 1018  BP: (!) 146/86  Pulse: 65  Resp: 18  Temp: 36.9 C    Last Pain:  Vitals:   09/01/16 1018  TempSrc: Oral         Complications: No apparent anesthesia complications

## 2016-09-01 NOTE — H&P (Signed)
Victoria Mccormick is an 66 y.o. female.   Chief Complaint: stridor HPI: subglottic stenosis  Past Medical History:  Diagnosis Date  . 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 Diabetes mellitus   . GERD (gastroesophageal reflux disease)   . Hyperlipemia   . Hypertension   . Subglottic stenosis   . Wears glasses    reading    Past Surgical History:  Procedure Laterality Date  . CESAREAN SECTION     x2  . DIRECT LARYNGOSCOPY     glottic stenosis dilated-multiple times-2003to -2013 #8  . KNEE ARTHROSCOPY Left 12/2015  . MICROLARYNGOSCOPY WITH LASER AND BALLOON DILATION N/A 08/15/2012   Procedure: MICROLARYNGOSCOPY WITH LASER AND BALLOON DILATION;  Surgeon: [name], MD;  Location: [place];  Service: ENT;  Laterality: N/A;  Tracheal Dilation  . MICROLARYNGOSCOPY WITH LASER AND BALLOON DILATION N/A 01/31/2013   Procedure: MICROLARYNGOSCOPY WITH LASER AND BALLOON DILATION;  Surgeon: [name], MD;  Location: [place];  Service: ENT;  Laterality: N/A;  . MICROLARYNGOSCOPY WITH LASER AND BALLOON DILATION N/A 08/18/2013   Procedure: MICROLARYNGOSCOPY WITH LASER AND BALLOON DILATION;  Surgeon: [name], MD;  Location: [place];  Service: ENT;  Laterality: N/A;  . MICROLARYNGOSCOPY WITH LASER AND BALLOON DILATION N/A 08/27/2014   Procedure: MICROLARYNGOSCOPY WITH LASER AND BALLOON DILATION;  Surgeon: [name], MD;  Location: [place];  Service: ENT;  Laterality: N/A;  . MICROLARYNGOSCOPY WITH LASER AND BALLOON DILATION N/A 07/15/2015   Procedure: MICROLARYNGOSCOPY WITH LASER AND BALLOON DILATION;  Surgeon: [name], MD;  Location: [place];  Service: ENT;  Laterality: N/A;  . MICROLARYNGOSCOPY WITH LASER AND BALLOON DILATION N/A 01/28/2016   Procedure: MICROLARYNGOSCOPY WITH LASER AND BALLOON DILATION;  Surgeon: [name], MD;  Location: [place];  Service: ENT;  Laterality: N/A;    History reviewed. No pertinent family history. Social History:  reports that she has never smoked. She has never used smokeless tobacco. She reports that she drinks alcohol. She reports that she does not use drugs.  Allergies: No Known Allergies  Medications Prior to Admission  Medication Sig Dispense Refill  . Cinnamon 500 MG TABS Take by mouth.    . esomeprazole (NEXIUM) 40 MG capsule Take 40 mg by mouth daily at 12 noon.    . hydrochlorothiazide (HYDRODIURIL) 25 MG tablet Take 25 mg by mouth daily.    Marland Kitchen lisinopril (PRINIVIL,ZESTRIL) 20 MG tablet Take 20 mg by mouth daily.    . magnesium 30 MG tablet Take 30 mg by mouth 2 (two) times daily.    . metFORMIN (GLUCOPHAGE) 500 MG tablet Take 1,000 mg by mouth 2 (two) times daily with a meal.    . metoprolol succinate (TOPROL-XL) 50 MG 24 hr tablet Take 50 mg by mouth daily. Take with or immediately following a meal.    . Multiple Vitamin (MULTIVITAMIN) capsule Take 1 capsule by mouth daily.    . Omega-3 Fatty Acids (FISH OIL) 1000 MG CAPS Take by mouth.    . saxagliptin HCl (ONGLYZA) 2.5 MG TABS tablet Take 5 mg by mouth daily.    . simvastatin (ZOCOR) 40 MG tablet Take 40 mg by mouth daily.    . Turmeric 500 MG CAPS Take by mouth.      Results for orders placed or performed during the hospital encounter of 09/01/16 (from the past 48 hour(s))  Glucose,  capillary     Status: Abnormal   Collection Time: 09/01/16 10:46 AM  Result Value Ref Range   Glucose-Capillary 120 (H) 65 - 99 mg/dL   No results found.  ROS: otherwise negative  Blood pressure (!) 146/86, pulse 65, temperature 98.5 F (36.9 C), temperature source Oral, resp. rate 18, height 5\' 7"  (1.702 m), weight 77.2 kg (170 lb 4 oz), SpO2 100 %.  PHYSICAL EXAM: Overall appearance:  Healthy appearing, in no distress, inspiratory stridor. Head:  Normocephalic, atraumatic. Ears: External auditory canals are clear; tympanic  membranes are intact and the middle ears are free of any effusion. Nose: External nose is healthy in appearance. Internal nasal exam free of any lesions or obstruction. Oral Cavity/pharynx:  There are no mucosal lesions or masses identified. Neuro:  No identifiable neurologic deficits. Neck: No palpable neck masses.  Studies Reviewed: none    Assessment/Plan Baloon dilation.  Mozelle Remlinger 09/01/2016, 11:14 AM

## 2017-02-07 ENCOUNTER — Other Ambulatory Visit: Payer: Self-pay | Admitting: Family Medicine

## 2017-02-07 DIAGNOSIS — Z1231 Encounter for screening mammogram for malignant neoplasm of breast: Secondary | ICD-10-CM

## 2017-03-09 ENCOUNTER — Ambulatory Visit
Admission: RE | Admit: 2017-03-09 | Discharge: 2017-03-09 | Disposition: A | Discharge status: Home / Self Care | Payer: Medicare Other | Source: Ambulatory Visit | Attending: Family Medicine | Admitting: Family Medicine

## 2017-03-09 DIAGNOSIS — Z1231 Encounter for screening mammogram for malignant neoplasm of breast: Secondary | ICD-10-CM

## 2017-05-06 NOTE — H&P (Signed)
  Victoria Mccormick is an 67 y.o. female.   Chief Complaint: stridor HPI: worsening stridor, history subglottic stenosis.  Past Medical History:  Diagnosis Date  . 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 Diabetes mellitus   . GERD (gastroesophageal reflux disease)   . Hyperlipemia   . Hypertension   . Subglottic stenosis   . Wears glasses    reading    Past Surgical History:  Procedure Laterality Date  . CESAREAN SECTION     x2  . DIRECT LARYNGOSCOPY     glottic stenosis dilated-multiple times-2003to -2013 #8  . KNEE ARTHROSCOPY Left 12/2015  . MICROLARYNGOSCOPY WITH DILATION N/A 09/01/2016   Procedure: MICROLARYNGOSCOPY WITH BALLOON DILATION;  Surgeon: [name], MD;  Location: [facility];  Service: ENT;  Laterality: N/A;  . MICROLARYNGOSCOPY WITH LASER AND BALLOON DILATION N/A 08/15/2012   Procedure: MICROLARYNGOSCOPY WITH LASER AND BALLOON DILATION;  Surgeon: [name], MD;  Location: [place];  Service: ENT;  Laterality: N/A;  Tracheal Dilation  . MICROLARYNGOSCOPY WITH LASER AND BALLOON DILATION N/A 01/31/2013   Procedure: MICROLARYNGOSCOPY WITH LASER AND BALLOON DILATION;  Surgeon: [name], MD;  Location: [place];  Service: ENT;  Laterality: N/A;  . MICROLARYNGOSCOPY WITH LASER AND BALLOON DILATION N/A 08/18/2013   Procedure: MICROLARYNGOSCOPY WITH LASER AND BALLOON DILATION;  Surgeon: [name], MD;  Location: [place];  Service: ENT;  Laterality: N/A;  . MICROLARYNGOSCOPY WITH LASER AND BALLOON DILATION N/A 08/27/2014   Procedure: MICROLARYNGOSCOPY WITH LASER AND BALLOON DILATION;  Surgeon: [name], MD;  Location: [place];  Service: ENT;  Laterality: N/A;  . MICROLARYNGOSCOPY WITH LASER AND BALLOON DILATION N/A 07/15/2015   Procedure: MICROLARYNGOSCOPY WITH LASER AND BALLOON DILATION;  Surgeon: [name], MD;  Location: [place];  Service: ENT;  Laterality: N/A;  . MICROLARYNGOSCOPY WITH LASER AND BALLOON DILATION N/A 01/28/2016   Procedure: MICROLARYNGOSCOPY WITH LASER AND BALLOON DILATION;  Surgeon: [name], MD;  Location: [place];  Service: ENT;  Laterality: N/A;    No family history on file. Social History:  reports that  has never smoked. she has never used smokeless tobacco. She reports that she drinks alcohol. She reports that she does not use drugs.  Allergies: No Known Allergies  No medications prior to admission.    No results found for this or any previous visit (from the past 48 hour(s)). No results found.  ROS: otherwise negative  There were no vitals taken for this visit.  PHYSICAL EXAM: Overall appearance:  Healthy appearing, in no distress. Inspiratory stridor. Head:  Normocephalic, atraumatic. Ears: External auditory canals are clear; tympanic membranes are intact and the middle ears are free of any effusion. Nose: External nose is healthy in appearance. Internal nasal exam free of any lesions or obstruction. Oral Cavity/pharynx:  There are no mucosal lesions or masses identified. Neuro:  No identifiable neurologic deficits. Neck: No palpable neck masses.  Studies Reviewed: none    Assessment/Plan Proceed with microlaryngoscpy, possible laser, and baloon dilation.  [name] 05/06/2017, 7:07 PM

## 2017-05-11 ENCOUNTER — Other Ambulatory Visit: Payer: Self-pay

## 2017-05-11 ENCOUNTER — Encounter (HOSPITAL_BASED_OUTPATIENT_CLINIC_OR_DEPARTMENT_OTHER): Payer: Self-pay | Admitting: *Deleted

## 2017-05-11 NOTE — Progress Notes (Signed)
Plans to come Monday for BMET and EKG. Bring all medications day of surgery.

## 2017-05-14 ENCOUNTER — Encounter (HOSPITAL_BASED_OUTPATIENT_CLINIC_OR_DEPARTMENT_OTHER)
Admission: RE | Admit: 2017-05-14 | Discharge: 2017-05-14 | Disposition: A | Discharge status: Home / Self Care | Payer: Medicare Other | Source: Ambulatory Visit | Attending: Otolaryngology | Admitting: Otolaryngology

## 2017-05-14 DIAGNOSIS — Z01810 Encounter for preprocedural cardiovascular examination: Secondary | ICD-10-CM | POA: Insufficient documentation

## 2017-05-14 DIAGNOSIS — Z01812 Encounter for preprocedural laboratory examination: Secondary | ICD-10-CM | POA: Diagnosis present

## 2017-05-14 DIAGNOSIS — I10 Essential (primary) hypertension: Secondary | ICD-10-CM | POA: Insufficient documentation

## 2017-05-14 LAB — BASIC METABOLIC PANEL
ANION GAP: 12 (ref 5–15)
BUN: 17 mg/dL (ref 6–20)
CALCIUM: 9.1 mg/dL (ref 8.9–10.3)
CO2: 25 mmol/L (ref 22–32)
CREATININE: 0.87 mg/dL (ref 0.44–1.00)
Chloride: 100 mmol/L — ABNORMAL LOW (ref 101–111)
GFR calc Af Amer: >60 mL/min (ref >60)
GFR calc non Af Amer: >60 mL/min (ref >60)
GLUCOSE: 126 mg/dL — AB (ref 65–99)
Potassium: 3.9 mmol/L (ref 3.5–5.1)
Sodium: 137 mmol/L (ref 135–145)

## 2017-05-18 ENCOUNTER — Ambulatory Visit (HOSPITAL_BASED_OUTPATIENT_CLINIC_OR_DEPARTMENT_OTHER): Payer: Medicare Other | Admitting: Certified Registered"

## 2017-05-18 ENCOUNTER — Encounter (HOSPITAL_BASED_OUTPATIENT_CLINIC_OR_DEPARTMENT_OTHER)
Admission: RE | Disposition: A | Discharge status: Home / Self Care | Payer: Self-pay | Source: Ambulatory Visit | Attending: Otolaryngology

## 2017-05-18 ENCOUNTER — Encounter (HOSPITAL_BASED_OUTPATIENT_CLINIC_OR_DEPARTMENT_OTHER): Payer: Self-pay | Admitting: *Deleted

## 2017-05-18 ENCOUNTER — Ambulatory Visit (HOSPITAL_BASED_OUTPATIENT_CLINIC_OR_DEPARTMENT_OTHER)
Admission: RE | Admit: 2017-05-18 | Discharge: 2017-05-18 | Disposition: A | Discharge status: Home / Self Care | Payer: Medicare Other | Source: Ambulatory Visit | Attending: Otolaryngology | Admitting: Otolaryngology

## 2017-05-18 ENCOUNTER — Other Ambulatory Visit: Payer: Self-pay

## 2017-05-18 DIAGNOSIS — Z87891 Personal history of nicotine dependence: Secondary | ICD-10-CM | POA: Insufficient documentation

## 2017-05-18 DIAGNOSIS — E785 Hyperlipidemia, unspecified: Secondary | ICD-10-CM | POA: Diagnosis not present

## 2017-05-18 DIAGNOSIS — K219 Gastro-esophageal reflux disease without esophagitis: Secondary | ICD-10-CM | POA: Diagnosis not present

## 2017-05-18 DIAGNOSIS — I10 Essential (primary) hypertension: Secondary | ICD-10-CM | POA: Diagnosis not present

## 2017-05-18 DIAGNOSIS — E119 Type 2 diabetes mellitus without complications: Secondary | ICD-10-CM | POA: Diagnosis not present

## 2017-05-18 DIAGNOSIS — J386 Stenosis of larynx: Secondary | ICD-10-CM | POA: Insufficient documentation

## 2017-05-18 HISTORY — DX: Unspecified osteoarthritis, unspecified site: M19.90

## 2017-05-18 HISTORY — PX: MICROLARYNGOSCOPY WITH LASER AND BALLOON DILATION: SHX5973

## 2017-05-18 LAB — GLUCOSE, CAPILLARY
Glucose-Capillary: 136 mg/dL — ABNORMAL HIGH (ref 65–99)
Glucose-Capillary: 122 mg/dL — ABNORMAL HIGH (ref 65–99)

## 2017-05-18 SURGERY — MICROLARYNGOSCOPY WITH LASER AND BALLOON DILATION
Anesthesia: General | Site: Throat

## 2017-05-18 MED ORDER — FENTANYL CITRATE (PF) 100 MCG/2ML IJ SOLN: 25.0000 ug | INTRAMUSCULAR | Status: DC | PRN

## 2017-05-18 MED ORDER — SCOPOLAMINE 1 MG/3DAYS TD PT72: 1.0000 | MEDICATED_PATCH | Freq: Once | TRANSDERMAL | Status: DC | PRN

## 2017-05-18 MED ORDER — PROMETHAZINE HCL 25 MG/ML IJ SOLN: 6.2500 mg | INTRAMUSCULAR | Status: DC | PRN

## 2017-05-18 MED ORDER — MIDAZOLAM HCL 2 MG/2ML IJ SOLN: 1.0000 mg | INTRAMUSCULAR | Status: DC | PRN

## 2017-05-18 MED ORDER — LIDOCAINE HCL (CARDIAC) 20 MG/ML IV SOLN
INTRAVENOUS | Status: DC | PRN
  Administered 2017-05-18: 60 mg via INTRAVENOUS

## 2017-05-18 MED ORDER — FENTANYL CITRATE (PF) 100 MCG/2ML IJ SOLN
INTRAMUSCULAR | Status: AC
  Filled 2017-05-18: qty 2

## 2017-05-18 MED ORDER — DEXAMETHASONE SODIUM PHOSPHATE 10 MG/ML IJ SOLN
INTRAMUSCULAR | Status: AC
  Filled 2017-05-18: qty 3

## 2017-05-18 MED ORDER — SUGAMMADEX SODIUM 200 MG/2ML IV SOLN
INTRAVENOUS | Status: DC | PRN
  Administered 2017-05-18: 200 mg via INTRAVENOUS

## 2017-05-18 MED ORDER — ROCURONIUM BROMIDE 10 MG/ML (PF) SYRINGE
PREFILLED_SYRINGE | INTRAVENOUS | Status: AC
  Filled 2017-05-18: qty 10

## 2017-05-18 MED ORDER — ONDANSETRON HCL 4 MG/2ML IJ SOLN
INTRAMUSCULAR | Status: DC | PRN
  Administered 2017-05-18: 4 mg via INTRAVENOUS

## 2017-05-18 MED ORDER — DEXAMETHASONE SODIUM PHOSPHATE 4 MG/ML IJ SOLN
INTRAMUSCULAR | Status: DC | PRN
  Administered 2017-05-18: 10 mg via INTRAVENOUS

## 2017-05-18 MED ORDER — FENTANYL CITRATE (PF) 100 MCG/2ML IJ SOLN
50.0000 ug | INTRAMUSCULAR | Status: DC | PRN
  Administered 2017-05-18 (×2): 50 ug via INTRAVENOUS

## 2017-05-18 MED ORDER — PROPOFOL 10 MG/ML IV BOLUS
INTRAVENOUS | Status: DC | PRN
  Administered 2017-05-18: 20 mg via INTRAVENOUS
  Administered 2017-05-18: 150 mg via INTRAVENOUS
  Administered 2017-05-18 (×2): 20 mg via INTRAVENOUS

## 2017-05-18 MED ORDER — PROPOFOL 500 MG/50ML IV EMUL
INTRAVENOUS | Status: AC
  Filled 2017-05-18: qty 50

## 2017-05-18 MED ORDER — METHYLENE BLUE 0.5 % INJ SOLN
INTRAVENOUS | Status: AC
  Filled 2017-05-18: qty 10

## 2017-05-18 MED ORDER — LACTATED RINGERS IV SOLN
INTRAVENOUS | Status: DC
  Administered 2017-05-18 (×2): via INTRAVENOUS

## 2017-05-18 MED ORDER — OXYCODONE HCL 5 MG/5ML PO SOLN: 5.0000 mg | Freq: Once | ORAL | Status: DC | PRN

## 2017-05-18 MED ORDER — ROCURONIUM BROMIDE 100 MG/10ML IV SOLN
INTRAVENOUS | Status: DC | PRN
  Administered 2017-05-18: 40 mg via INTRAVENOUS

## 2017-05-18 MED ORDER — OXYCODONE HCL 5 MG PO TABS: 5.0000 mg | ORAL_TABLET | Freq: Once | ORAL | Status: DC | PRN

## 2017-05-18 MED ORDER — ONDANSETRON HCL 4 MG/2ML IJ SOLN
INTRAMUSCULAR | Status: AC
Start: 2017-05-18 — End: 2017-05-18
  Filled 2017-05-18: qty 18

## 2017-05-18 MED ORDER — CLINDAMYCIN HCL 300 MG PO CAPS: 300.0000 mg | ORAL_CAPSULE | Freq: Three times a day (TID) | ORAL | 0 refills | Status: DC

## 2017-05-18 SURGICAL SUPPLY — 44 items
BALLN PULM 12 13.5 15X75 (BALLOONS) ×2
BALLN PULM 12 13.5 15X75CM (BALLOONS) ×1
BALLN PULM 15 16.5 18 X 75CM (BALLOONS)
BALLN PULM 15 16.5 18X75 (BALLOONS)
BALLN PULMONARY 10 12MM (MISCELLANEOUS)
BALLN PULMONARY 10-12 (MISCELLANEOUS) IMPLANT
BALLN PULMONARY 8 10 OD75CM (MISCELLANEOUS)
BALLN PULMONARY 8-10 OD75 (MISCELLANEOUS) IMPLANT
BALLOON PULM 12 13.5 15X75 (BALLOONS) IMPLANT
BALLOON PULM 15 16.5 18X75 (BALLOONS) IMPLANT
BANDAGE EYE OVAL (MISCELLANEOUS) ×6 IMPLANT
CANISTER SUCT 1200ML W/VALVE (MISCELLANEOUS) ×3 IMPLANT
DEPRESSOR TONGUE BLADE STERILE (MISCELLANEOUS) ×3 IMPLANT
FILTER 7/8 IN (FILTER) ×2 IMPLANT
GAUZE SPONGE 4X4 12PLY STRL LF (GAUZE/BANDAGES/DRESSINGS) ×6 IMPLANT
GLOVE BIO SURGEON STRL SZ 6.5 (GLOVE) ×1 IMPLANT
GLOVE BIO SURGEONS STRL SZ 6.5 (GLOVE) ×1
GLOVE BIOGEL PI IND STRL 6.5 (GLOVE) ×1 IMPLANT
GLOVE BIOGEL PI INDICATOR 6.5 (GLOVE) ×2
GLOVE ECLIPSE 7.5 STRL STRAW (GLOVE) ×3 IMPLANT
GLOVE SURG SS PI 7.0 STRL IVOR (GLOVE) ×2 IMPLANT
GOWN STRL REUS W/ TWL LRG LVL3 (GOWN DISPOSABLE) ×2 IMPLANT
GOWN STRL REUS W/ TWL XL LVL3 (GOWN DISPOSABLE) IMPLANT
GOWN STRL REUS W/TWL LRG LVL3 (GOWN DISPOSABLE) ×6
GOWN STRL REUS W/TWL XL LVL3 (GOWN DISPOSABLE)
GUARD TEETH (MISCELLANEOUS) ×2 IMPLANT
MARKER SKIN DUAL TIP RULER LAB (MISCELLANEOUS) IMPLANT
NDL SPNL 22GX7 QUINCKE BK (NEEDLE) IMPLANT
NEEDLE SPNL 22GX7 QUINCKE BK (NEEDLE) IMPLANT
NS IRRIG 1000ML POUR BTL (IV SOLUTION) ×3 IMPLANT
PACK BASIN DAY SURGERY FS (CUSTOM PROCEDURE TRAY) ×3 IMPLANT
PATTIES SURGICAL .5 X3 (DISPOSABLE) ×3 IMPLANT
REDUCTION FITTING 1/4 IN (FILTER) ×2 IMPLANT
SHEET MEDIUM DRAPE 40X70 STRL (DRAPES) ×3 IMPLANT
SLEEVE SCD COMPRESS KNEE MED (MISCELLANEOUS) ×2 IMPLANT
SOLUTION BUTLER CLEAR DIP (MISCELLANEOUS) IMPLANT
SURGILUBE 2OZ TUBE FLIPTOP (MISCELLANEOUS) IMPLANT
SYR 5ML LL (SYRINGE) IMPLANT
SYR CONTROL 10ML LL (SYRINGE) IMPLANT
SYR INFLATE BILIARY GAUGE (MISCELLANEOUS) ×2 IMPLANT
SYR TB 1ML LL NO SAFETY (SYRINGE) IMPLANT
TOWEL OR 17X24 6PK STRL BLUE (TOWEL DISPOSABLE) ×5 IMPLANT
TUBE CONNECTING 20'X1/4 (TUBING) ×2
TUBE CONNECTING 20X1/4 (TUBING) ×3 IMPLANT

## 2017-05-18 NOTE — Anesthesia Preprocedure Evaluation (Signed)
Anesthesia Evaluation  Patient identified by MRN, date of birth, ID band Patient awake    Reviewed: Allergy & Precautions, NPO status , Patient's Chart, lab work & pertinent test results  History of Anesthesia Complications (+) DIFFICULT AIRWAY  Airway Mallampati: III  TM Distance: <3 FB Neck ROM: Full    Dental no notable dental hx.    Pulmonary neg pulmonary ROS, former smoker,    Pulmonary exam normal breath sounds clear to auscultation       Cardiovascular hypertension, Pt. on medications and Pt. on home beta blockers Normal cardiovascular exam Rhythm:Regular Rate:Normal     Neuro/Psych negative neurological ROS  negative psych ROS   GI/Hepatic negative GI ROS, Neg liver ROS,   Endo/Other  diabetes  Renal/GU negative Renal ROS  negative genitourinary   Musculoskeletal negative musculoskeletal ROS (+)   Abdominal   Peds negative pediatric ROS (+)  Hematology negative hematology ROS (+)   Anesthesia Other Findings   Reproductive/Obstetrics negative OB ROS                             Anesthesia Physical Anesthesia Plan  ASA: II  Anesthesia Plan: General   Post-op Pain Management:    Induction: Intravenous  PONV Risk Score and Plan: 3 and Ondansetron, Dexamethasone and Treatment may vary due to age or medical condition  Airway Management Planned: Oral ETT and Video Laryngoscope Planned  Additional Equipment:   Intra-op Plan:   Post-operative Plan: Extubation in OR  Informed Consent: I have reviewed the patients History and Physical, chart, labs and discussed the procedure including the risks, benefits and alternatives for the proposed anesthesia with the patient or authorized representative who has indicated his/her understanding and acceptance.   Dental advisory given  Plan Discussed with: CRNA and Surgeon  Anesthesia Plan Comments: (5.0 ETT used last procedure)         Anesthesia Quick Evaluation

## 2017-05-18 NOTE — Discharge Instructions (Signed)

## 2017-05-18 NOTE — Anesthesia Postprocedure Evaluation (Signed)
Anesthesia Post Note  Patient: Victoria Mccormick  Procedure(s) Performed: MICROLARYNGOSCOPY WITH LASER AND BALLOON DILATION (N/A Throat)     Patient location during evaluation: PACU Anesthesia Type: General Level of consciousness: awake and alert Pain management: pain level controlled Vital Signs Assessment: post-procedure vital signs reviewed and stable Respiratory status: spontaneous breathing, nonlabored ventilation, respiratory function stable and patient connected to nasal cannula oxygen Cardiovascular status: blood pressure returned to baseline and stable Postop Assessment: no apparent nausea or vomiting Anesthetic complications: no    Last Vitals:  Vitals:   05/18/17 1145 05/18/17 1200  BP: (!) 152/75 (!) 144/73  Pulse: (!) 58 62  Resp: 11 16  Temp:    SpO2: 100% 100%    Last Pain:  Vitals:   05/18/17 1200  TempSrc:   PainSc: 0-No pain                 Jenea Dake S

## 2017-05-18 NOTE — Interval H&P Note (Signed)
History and Physical Interval Note:  05/18/2017 10:14 AM  Victoria Mccormick  has presented today for surgery, with the diagnosis of SUBGLOTTIC STENOSIS  The various methods of treatment have been discussed with the patient and family. After consideration of risks, benefits and other options for treatment, the patient has consented to  Procedure(s): MICROLARYNGOSCOPY WITH LASER AND BALLOON DILATION (N/A) as a surgical intervention .  The patient's history has been reviewed, patient examined, no change in status, stable for surgery.  I have reviewed the patient's chart and labs.  Questions were answered to the patient's satisfaction.     [name]

## 2017-05-18 NOTE — Op Note (Signed)
OPERATIVE REPORT  DATE OF SURGERY: 05/18/2017  PATIENT:  Victoria Mccormick,  67 y.o. female  PRE-OPERATIVE DIAGNOSIS:  SUBGLOTTIC STENOSIS  POST-OPERATIVE DIAGNOSIS:  SUBGLOTTIC STENOSIS  PROCEDURE:  Procedure(s): MICROLARYNGOSCOPY WITH LASER AND BALLOON DILATION  SURGEON:  Beckie Salts, MD  ASSISTANTS: None  ANESTHESIA:   General   EBL: 10 ml  DRAINS: None  LOCAL MEDICATIONS USED:  None  SPECIMEN:  none  COUNTS:  Correct  PROCEDURE DETAILS: The patient was taken to the operating room and placed on the operating table in the supine position. Following induction of general endotracheal anesthesia, using a #5 endotracheal tube, the table was turned 90 degrees and the patient was draped in a standard fashion.  A maxillary tooth protector was used throughout the case.  Saline soaked eye pads and wet towels were placed around the face for laser safety.  A Jako laryngoscope was entered into the oral cavity and used to view the larynx.  This was attached to the [place] stand using the suspension apparatus.  The endotracheal tube was removed and was replaced through the laryngoscope.  The multiple treatment episodes were performed with the tube out apneic.  The operating microscope and the CO2 laser were then brought into view and used to view the subglottic stenosis.  A thin stenotic segment was identified.  It was partially torn from the intubation.  The remaining stenosis was radially cut using the CO2 laser at 2 W continuous power at 10:00 3:00 2:00 and 6:00.  The balloon dilator was then used 3 times.  The first time in the upper stenosis at 4.5 cm water pressure for 30 seconds and then 2 more times, once upper and one in the more inferior aspect of the stenosis at 8 cm water pressure also for 30 seconds each.  At the termination of the procedure a #7 endotracheal tube was placed without difficulty.  The laryngoscope was removed.  The patient was then handed back to the care of  anesthesia who allowed her to awaken and then extubated her safely without difficulty.  She was then transferred to PACU in stable condition.    PATIENT DISPOSITION:  To PACU, stable

## 2017-05-18 NOTE — Anesthesia Procedure Notes (Signed)
Procedure Name: Intubation Date/Time: 05/18/2017 10:37 AM Performed by: Signe Colt, CRNA Pre-anesthesia Checklist: Patient identified, Emergency Drugs available, Suction available and Patient being monitored Patient Re-evaluated:Patient Re-evaluated prior to induction Oxygen Delivery Method: Circle system utilized Preoxygenation: Pre-oxygenation with 100% oxygen Induction Type: IV induction Ventilation: Mask ventilation without difficulty Laryngoscope Size: Glidescope Grade View: Grade I Tube type: Oral Tube size: 5.0 mm Number of attempts: 1 Airway Equipment and Method: Stylet and Oral airway Placement Confirmation: ETT inserted through vocal cords under direct vision,  positive ETCO2 and breath sounds checked- equal and bilateral Secured at: 21 cm Tube secured with: Tape Dental Injury: Teeth and Oropharynx as per pre-operative assessment  Difficulty Due To: Difficulty was anticipated Comments: Difficulty anticipated, smooth IV inductio, easy mask airway, DL x1 with glide scope , VC visualized easy atraumatic placement of 5.0 ETT, dentition unchanged

## 2017-05-18 NOTE — Transfer of Care (Signed)
Immediate Anesthesia Transfer of Care Note  Patient: Victoria Mccormick  Procedure(s) Performed: MICROLARYNGOSCOPY WITH LASER AND BALLOON DILATION (N/A Throat)  Patient Location: PACU  Anesthesia Type:General  Level of Consciousness: awake, alert , oriented and patient cooperative  Airway & Oxygen Therapy: Patient Spontanous Breathing and Patient connected to face mask oxygen  Post-op Assessment: Report given to RN and Post -op Vital signs reviewed and stable  Post vital signs: Reviewed and stable  Last Vitals:  Vitals:   05/18/17 0921  BP: (!) 142/74  Pulse: 60  Resp: 16  Temp: 36.9 C  SpO2: 100%    Last Pain:  Vitals:   05/18/17 0921  TempSrc: Oral      Patients Stated Pain Goal: 0 (99/24/26 8341)  Complications: No apparent anesthesia complications

## 2017-05-21 ENCOUNTER — Encounter (HOSPITAL_BASED_OUTPATIENT_CLINIC_OR_DEPARTMENT_OTHER): Payer: Self-pay | Admitting: Otolaryngology

## 2018-01-03 ENCOUNTER — Other Ambulatory Visit: Payer: Self-pay

## 2018-01-03 NOTE — Progress Notes (Signed)
Pre op call complete and patient told to take toprol XL and nexium DOS, hold vitamins and supplements for 5 days such as her multi vitamin, magnesium, cinnamon. Pt also told to arrive to PAT at [facility] for blood work (BMET) the week prior to [employer] 11. 2019. Pt verbalized understanding of all instructions.

## 2018-01-18 NOTE — H&P (Signed)
Victoria Mccormick is an 67 y.o. female.   Chief Complaint: stridor HPI: idiopathic subglottic stenosis.  Past Medical History:  Diagnosis Date  . Arthritis   . 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 Complication of anesthesia    generalized muscle weakness after anesthesia for many days  . Diabetes mellitus   . GERD (gastroesophageal reflux disease)   . Hyperlipemia   . Hypertension   . Subglottic stenosis   . Wears glasses    reading    Past Surgical History:  Procedure Laterality Date  . CESAREAN SECTION     x2  . DIRECT LARYNGOSCOPY     glottic stenosis dilated-multiple times-2003to -2013 #8  . KNEE ARTHROSCOPY Left 12/2015  . MICROLARYNGOSCOPY WITH DILATION N/A 09/01/2016   Procedure: MICROLARYNGOSCOPY WITH BALLOON DILATION;  Surgeon: [name], MD;  Location: [place];  Service: ENT;  Laterality: N/A;  . MICROLARYNGOSCOPY WITH LASER AND BALLOON DILATION N/A 08/15/2012   Procedure: MICROLARYNGOSCOPY WITH LASER AND BALLOON DILATION;  Surgeon: [name], MD;  Location: [place];  Service: ENT;  Laterality: N/A;  Tracheal Dilation  . MICROLARYNGOSCOPY WITH LASER AND BALLOON DILATION N/A 01/31/2013   Procedure: MICROLARYNGOSCOPY WITH LASER AND BALLOON DILATION;  Surgeon: [name], MD;  Location: [place];  Service: ENT;  Laterality: N/A;  . MICROLARYNGOSCOPY WITH LASER AND BALLOON DILATION N/A 08/18/2013   Procedure: MICROLARYNGOSCOPY WITH LASER AND BALLOON DILATION;  Surgeon: [name], MD;  Location: [place];  Service: ENT;  Laterality: N/A;  . MICROLARYNGOSCOPY WITH LASER AND BALLOON DILATION N/A 08/27/2014   Procedure: MICROLARYNGOSCOPY WITH LASER AND BALLOON DILATION;  Surgeon: [name], MD;  Location: [place];  Service: ENT;  Laterality: N/A;  . MICROLARYNGOSCOPY WITH LASER AND BALLOON DILATION N/A 07/15/2015   Procedure:  MICROLARYNGOSCOPY WITH LASER AND BALLOON DILATION;  Surgeon: [name], MD;  Location: [place];  Service: ENT;  Laterality: N/A;  . MICROLARYNGOSCOPY WITH LASER AND BALLOON DILATION N/A 01/28/2016   Procedure: MICROLARYNGOSCOPY WITH LASER AND BALLOON DILATION;  Surgeon: [name], MD;  Location: [place];  Service: ENT;  Laterality: N/A;  . MICROLARYNGOSCOPY WITH LASER AND BALLOON DILATION N/A 05/18/2017   Procedure: MICROLARYNGOSCOPY WITH LASER AND BALLOON DILATION;  Surgeon: [name], MD;  Location: [place];  Service: ENT;  Laterality: N/A;    No family history on file. Social History:  reports that she has quit smoking. She has never used smokeless tobacco. She reports that she drinks alcohol. She reports that she does not use drugs.  Allergies: No Known Allergies  No medications prior to admission.    No results found for this or any previous visit (from the past 48 hour(s)). No results found.  ROS: otherwise negative  Height 5\' 7"  (1.702 m), weight 81.6 kg.  PHYSICAL EXAM: Overall appearance:  Healthy appearing, in no distress. Inspiratory stridor noted. Head:  Normocephalic, atraumatic. Ears: External auditory canals are clear; tympanic membranes are intact and the middle ears are free of any effusion. Nose: External nose is healthy in appearance. Internal nasal exam free of any lesions or obstruction. Oral Cavity/pharynx:  There are no mucosal lesions or masses identified. Hypopharynx/Larynx: no signs of any mucosal lesions or masses identified. Vocal cords move normally. Neuro:  No identifiable neurologic deficits. Neck: No palpable neck masses.  Studies Reviewed: none    Assessment/Plan Proceed with balloon  dilation, laser.  [name] 01/18/2018, 2:30 PM

## 2018-01-21 ENCOUNTER — Encounter (HOSPITAL_BASED_OUTPATIENT_CLINIC_OR_DEPARTMENT_OTHER)
Admission: RE | Admit: 2018-01-21 | Discharge: 2018-01-21 | Disposition: A | Discharge status: Home / Self Care | Payer: Medicare Other | Source: Ambulatory Visit | Attending: Otolaryngology | Admitting: Otolaryngology

## 2018-01-21 DIAGNOSIS — J386 Stenosis of larynx: Secondary | ICD-10-CM | POA: Diagnosis not present

## 2018-01-21 LAB — BASIC METABOLIC PANEL
ANION GAP: 6 (ref 5–15)
BUN: 21 mg/dL (ref 8–23)
CO2: 29 mmol/L (ref 22–32)
Calcium: 9.3 mg/dL (ref 8.9–10.3)
Chloride: 101 mmol/L (ref 98–111)
Creatinine, Ser: 0.89 mg/dL (ref 0.44–1.00)
GFR calc Af Amer: >60 mL/min (ref >60)
Glucose, Bld: 159 mg/dL — ABNORMAL HIGH (ref 70–99)
POTASSIUM: 4.3 mmol/L (ref 3.5–5.1)
SODIUM: 136 mmol/L (ref 135–145)

## 2018-01-25 NOTE — Progress Notes (Signed)
Patient called me today to say she went to Urgent Care with urinary symptoms. She stated she was diagnosed with UTI and given Fluconazole150mg , and Nitrofuratonin 100mg . Stated no fever. Advised her to call Dr Janeice Robinson office if she should run a fever or symptoms worsen.

## 2018-01-27 NOTE — Anesthesia Preprocedure Evaluation (Signed)
Anesthesia Evaluation  Patient identified by MRN, date of birth, ID band Patient awake    Reviewed: Allergy & Precautions, NPO status , Patient's Chart, lab work & pertinent test results  History of Anesthesia Complications (+) DIFFICULT AIRWAY and history of anesthetic complications  Airway Mallampati: III  TM Distance: <3 FB Neck ROM: Full    Dental no notable dental hx.    Pulmonary neg pulmonary ROS, former smoker,    Pulmonary exam normal breath sounds clear to auscultation       Cardiovascular hypertension, Pt. on medications and Pt. on home beta blockers Normal cardiovascular exam Rhythm:Regular Rate:Normal     Neuro/Psych negative neurological ROS  negative psych ROS   GI/Hepatic Neg liver ROS, GERD  ,  Endo/Other  diabetes  Renal/GU negative Renal ROS  negative genitourinary   Musculoskeletal negative musculoskeletal ROS (+)   Abdominal   Peds negative pediatric ROS (+)  Hematology negative hematology ROS (+)   Anesthesia Other Findings   Reproductive/Obstetrics negative OB ROS                             Anesthesia Physical  Anesthesia Plan  ASA: II  Anesthesia Plan: General   Post-op Pain Management:    Induction: Intravenous  PONV Risk Score and Plan: 3 and Ondansetron, Dexamethasone and Treatment may vary due to age or medical condition  Airway Management Planned: Oral ETT and Video Laryngoscope Planned  Additional Equipment:   Intra-op Plan:   Post-operative Plan: Extubation in OR  Informed Consent: I have reviewed the patients History and Physical, chart, labs and discussed the procedure including the risks, benefits and alternatives for the proposed anesthesia with the patient or authorized representative who has indicated his/her understanding and acceptance.   Dental advisory given  Plan Discussed with: CRNA and Surgeon  Anesthesia Plan Comments:  (5.0 ETT used last procedure)        Anesthesia Quick Evaluation

## 2018-01-28 ENCOUNTER — Ambulatory Visit (HOSPITAL_BASED_OUTPATIENT_CLINIC_OR_DEPARTMENT_OTHER)
Admission: RE | Admit: 2018-01-28 | Discharge: 2018-01-28 | Disposition: A | Discharge status: Home / Self Care | Payer: Medicare Other | Source: Ambulatory Visit | Attending: Otolaryngology | Admitting: Otolaryngology

## 2018-01-28 ENCOUNTER — Ambulatory Visit (HOSPITAL_BASED_OUTPATIENT_CLINIC_OR_DEPARTMENT_OTHER): Payer: Medicare Other | Admitting: Anesthesiology

## 2018-01-28 ENCOUNTER — Encounter (HOSPITAL_BASED_OUTPATIENT_CLINIC_OR_DEPARTMENT_OTHER): Payer: Self-pay | Admitting: Anesthesiology

## 2018-01-28 ENCOUNTER — Other Ambulatory Visit: Payer: Self-pay

## 2018-01-28 ENCOUNTER — Encounter (HOSPITAL_BASED_OUTPATIENT_CLINIC_OR_DEPARTMENT_OTHER)
Admission: RE | Disposition: A | Discharge status: Home / Self Care | Payer: Self-pay | Source: Ambulatory Visit | Attending: Otolaryngology

## 2018-01-28 DIAGNOSIS — I10 Essential (primary) hypertension: Secondary | ICD-10-CM | POA: Diagnosis not present

## 2018-01-28 DIAGNOSIS — J386 Stenosis of larynx: Secondary | ICD-10-CM | POA: Diagnosis not present

## 2018-01-28 DIAGNOSIS — Z87891 Personal history of nicotine dependence: Secondary | ICD-10-CM | POA: Diagnosis not present

## 2018-01-28 DIAGNOSIS — K219 Gastro-esophageal reflux disease without esophagitis: Secondary | ICD-10-CM | POA: Insufficient documentation

## 2018-01-28 DIAGNOSIS — M1990 Unspecified osteoarthritis, unspecified site: Secondary | ICD-10-CM | POA: Insufficient documentation

## 2018-01-28 DIAGNOSIS — E119 Type 2 diabetes mellitus without complications: Secondary | ICD-10-CM | POA: Insufficient documentation

## 2018-01-28 DIAGNOSIS — Z79899 Other long term (current) drug therapy: Secondary | ICD-10-CM | POA: Diagnosis not present

## 2018-01-28 DIAGNOSIS — E785 Hyperlipidemia, unspecified: Secondary | ICD-10-CM | POA: Insufficient documentation

## 2018-01-28 HISTORY — PX: MICROLARYNGOSCOPY WITH LASER AND BALLOON DILATION: SHX5973

## 2018-01-28 LAB — GLUCOSE, CAPILLARY
GLUCOSE-CAPILLARY: 149 mg/dL — AB (ref 70–99)
GLUCOSE-CAPILLARY: 148 mg/dL — AB (ref 70–99)

## 2018-01-28 SURGERY — MICROLARYNGOSCOPY WITH LASER AND BALLOON DILATION
Anesthesia: General

## 2018-01-28 MED ORDER — ROCURONIUM BROMIDE 100 MG/10ML IV SOLN
INTRAVENOUS | Status: DC | PRN
  Administered 2018-01-28: 30 mg via INTRAVENOUS

## 2018-01-28 MED ORDER — EPINEPHRINE 30 MG/30ML IJ SOLN
INTRAMUSCULAR | Status: AC
  Filled 2018-01-28: qty 1

## 2018-01-28 MED ORDER — ONDANSETRON HCL 4 MG/2ML IJ SOLN
INTRAMUSCULAR | Status: AC
  Filled 2018-01-28: qty 2

## 2018-01-28 MED ORDER — DEXAMETHASONE SODIUM PHOSPHATE 10 MG/ML IJ SOLN
INTRAMUSCULAR | Status: AC
  Filled 2018-01-28: qty 1

## 2018-01-28 MED ORDER — FENTANYL CITRATE (PF) 100 MCG/2ML IJ SOLN
INTRAMUSCULAR | Status: AC
  Filled 2018-01-28: qty 2

## 2018-01-28 MED ORDER — FENTANYL CITRATE (PF) 100 MCG/2ML IJ SOLN: 25.0000 ug | INTRAMUSCULAR | Status: DC | PRN

## 2018-01-28 MED ORDER — PROPOFOL 500 MG/50ML IV EMUL
INTRAVENOUS | Status: DC | PRN
  Administered 2018-01-28: 100 ug/kg/min via INTRAVENOUS

## 2018-01-28 MED ORDER — MEPERIDINE HCL 25 MG/ML IJ SOLN: 6.2500 mg | INTRAMUSCULAR | Status: DC | PRN

## 2018-01-28 MED ORDER — ACETAMINOPHEN 160 MG/5ML PO SOLN: 325.0000 mg | ORAL | Status: DC | PRN

## 2018-01-28 MED ORDER — FENTANYL CITRATE (PF) 100 MCG/2ML IJ SOLN
50.0000 ug | INTRAMUSCULAR | Status: DC | PRN
  Administered 2018-01-28: 100 ug via INTRAVENOUS

## 2018-01-28 MED ORDER — SUGAMMADEX SODIUM 200 MG/2ML IV SOLN
INTRAVENOUS | Status: DC | PRN
  Administered 2018-01-28: 200 mg via INTRAVENOUS

## 2018-01-28 MED ORDER — DEXAMETHASONE SODIUM PHOSPHATE 4 MG/ML IJ SOLN
INTRAMUSCULAR | Status: DC | PRN
  Administered 2018-01-28: 10 mg via INTRAVENOUS

## 2018-01-28 MED ORDER — LIDOCAINE-EPINEPHRINE 1 %-1:100000 IJ SOLN
INTRAMUSCULAR | Status: AC
  Filled 2018-01-28: qty 1

## 2018-01-28 MED ORDER — OXYCODONE HCL 5 MG PO TABS: 5.0000 mg | ORAL_TABLET | Freq: Once | ORAL | Status: DC | PRN

## 2018-01-28 MED ORDER — EPINEPHRINE PF 1 MG/ML IJ SOLN
INTRAMUSCULAR | Status: DC | PRN
  Administered 2018-01-28: 1 mg via ENDOTRACHEOPULMONARY

## 2018-01-28 MED ORDER — LACTATED RINGERS IV SOLN
INTRAVENOUS | Status: DC
  Administered 2018-01-28: 07:00:00 via INTRAVENOUS

## 2018-01-28 MED ORDER — OXYCODONE HCL 5 MG/5ML PO SOLN: 5.0000 mg | Freq: Once | ORAL | Status: DC | PRN

## 2018-01-28 MED ORDER — PROPOFOL 10 MG/ML IV BOLUS
INTRAVENOUS | Status: DC | PRN
  Administered 2018-01-28: 150 mg via INTRAVENOUS

## 2018-01-28 MED ORDER — ONDANSETRON HCL 4 MG/2ML IJ SOLN
INTRAMUSCULAR | Status: DC | PRN
  Administered 2018-01-28: 4 mg via INTRAVENOUS

## 2018-01-28 MED ORDER — SCOPOLAMINE 1 MG/3DAYS TD PT72: 1.0000 | MEDICATED_PATCH | Freq: Once | TRANSDERMAL | Status: DC | PRN

## 2018-01-28 MED ORDER — MIDAZOLAM HCL 2 MG/2ML IJ SOLN
INTRAMUSCULAR | Status: AC
  Filled 2018-01-28: qty 2

## 2018-01-28 MED ORDER — ONDANSETRON HCL 4 MG/2ML IJ SOLN: 4.0000 mg | Freq: Once | INTRAMUSCULAR | Status: DC | PRN

## 2018-01-28 MED ORDER — ACETAMINOPHEN 325 MG PO TABS: 325.0000 mg | ORAL_TABLET | ORAL | Status: DC | PRN

## 2018-01-28 MED ORDER — PROPOFOL 500 MG/50ML IV EMUL
INTRAVENOUS | Status: AC
  Filled 2018-01-28: qty 50

## 2018-01-28 MED ORDER — MIDAZOLAM HCL 2 MG/2ML IJ SOLN
1.0000 mg | INTRAMUSCULAR | Status: DC | PRN
  Administered 2018-01-28: 2 mg via INTRAVENOUS

## 2018-01-28 SURGICAL SUPPLY — 36 items
BALLN PULM 15 16.5 18 X 75CM (BALLOONS)
BALLN PULM 15 16.5 18X75 (BALLOONS)
BALLN PULMONARY 10 12MM (MISCELLANEOUS) ×1
BALLN PULMONARY 10-12 (MISCELLANEOUS) ×1 IMPLANT
BALLN PULMONARY 8 10 OD75CM (MISCELLANEOUS)
BALLN PULMONARY 8-10 OD75 (MISCELLANEOUS) IMPLANT
BALLOON PULM 15 16.5 18X75 (BALLOONS) IMPLANT
BNDG EYE OVAL (GAUZE/BANDAGES/DRESSINGS) ×6 IMPLANT
CANISTER SUCT 1200ML W/VALVE (MISCELLANEOUS) ×3 IMPLANT
COVER WAND RF STERILE (DRAPES) IMPLANT
DEPRESSOR TONGUE BLADE STERILE (MISCELLANEOUS) ×3 IMPLANT
FILTER 7/8 IN (FILTER) ×2 IMPLANT
GAUZE SPONGE 4X4 12PLY STRL LF (GAUZE/BANDAGES/DRESSINGS) ×6 IMPLANT
GLOVE ECLIPSE 7.5 STRL STRAW (GLOVE) ×3 IMPLANT
GOWN STRL REUS W/ TWL LRG LVL3 (GOWN DISPOSABLE) IMPLANT
GOWN STRL REUS W/ TWL XL LVL3 (GOWN DISPOSABLE) IMPLANT
GOWN STRL REUS W/TWL LRG LVL3 (GOWN DISPOSABLE) ×6
GOWN STRL REUS W/TWL XL LVL3 (GOWN DISPOSABLE)
GUARD TEETH (MISCELLANEOUS) ×3 IMPLANT
MARKER SKIN DUAL TIP RULER LAB (MISCELLANEOUS) IMPLANT
NEEDLE SPNL 22GX7 QUINCKE BK (NEEDLE) IMPLANT
NS IRRIG 1000ML POUR BTL (IV SOLUTION) ×3 IMPLANT
PACK BASIN DAY SURGERY FS (CUSTOM PROCEDURE TRAY) ×3 IMPLANT
PATTIES SURGICAL .5 X3 (DISPOSABLE) ×3 IMPLANT
REDUCTION FITTING 1/4 IN (FILTER) ×2 IMPLANT
SHEET MEDIUM DRAPE 40X70 STRL (DRAPES) ×3 IMPLANT
SLEEVE SCD COMPRESS KNEE MED (MISCELLANEOUS) ×3 IMPLANT
SOLUTION BUTLER CLEAR DIP (MISCELLANEOUS) IMPLANT
SURGILUBE 2OZ TUBE FLIPTOP (MISCELLANEOUS) IMPLANT
SYR 5ML LL (SYRINGE) ×2 IMPLANT
SYR CONTROL 10ML LL (SYRINGE) IMPLANT
SYR INFLATE BILIARY GAUGE (MISCELLANEOUS) ×2 IMPLANT
SYR TB 1ML LL NO SAFETY (SYRINGE) IMPLANT
TOWEL GREEN STERILE FF (TOWEL DISPOSABLE) ×3 IMPLANT
TUBE CONNECTING 20'X1/4 (TUBING) ×1
TUBE CONNECTING 20X1/4 (TUBING) ×2 IMPLANT

## 2018-01-28 NOTE — Op Note (Signed)
OPERATIVE REPORT  DATE OF SURGERY: 01/28/2018  PATIENT:  Victoria Mccormick,  67 y.o. female  PRE-OPERATIVE DIAGNOSIS:  Subglottic stenosis  POST-OPERATIVE DIAGNOSIS:  Subglottic stenosis  PROCEDURE:  Procedure(s): MICROLARYNGOSCOPY with laser balloon dilation  SURGEON:  Beckie Salts, MD  ASSISTANTS: none  ANESTHESIA:   General   EBL: 10 ml  DRAINS: none  LOCAL MEDICATIONS USED:  None  SPECIMEN: None  COUNTS:  Correct  PROCEDURE DETAILS: The patient was taken to the operating room and placed on the operating table in the supine position. Following induction of general endotracheal anesthesia, the table was turned 90 and the patient was draped in a standard fashion.  A maxillary tooth protector was used throughout the case.  A Jako scope was used to evaluate the larynx and attached to the Mayo stand with the suspension apparatus.  The operating microscope was brought into view.  The glottis was normal in appearance.  The subglottic larynx contained a circumferential stenotic segment that was relatively soft.  There is a small projection of tissue coming out from about 5:00.  This was lasered off of the CO2 laser at 2 W continuous power.  Radial cuts were then created at 7:00, 9:00, 3:00.  The CRE Pulmonary Balloon balloon dilator was then used on 3 passes at 8 atmospheric pressure, corresponding to 12 mm.  Each time held for 30 seconds.  All of this was done using apneic technique removing and replacing the #5 endotracheal tube.  At the termination, a #7 endotracheal tube was then placed using a Jackson sliding laryngoscope.  Care of the patient was then handed back to anesthesia.  Patient was awakened extubated and transferred to recovery in stable condition.    PATIENT DISPOSITION:  To PACU, stable

## 2018-01-28 NOTE — Interval H&P Note (Signed)
History and Physical Interval Note:  01/28/2018 7:23 AM  Victoria Mccormick  has presented today for surgery, with the diagnosis of Subglottic stenosis  The various methods of treatment have been discussed with the patient and family. After consideration of risks, benefits and other options for treatment, the patient has consented to  Procedure(s): MICROLARYNGOSCOPY with laser balloon dilation (N/A) as a surgical intervention .  The patient's history has been reviewed, patient examined, no change in status, stable for surgery.  I have reviewed the patient's chart and labs.  Questions were answered to the patient's satisfaction.     [name]

## 2018-01-28 NOTE — Discharge Instructions (Signed)

## 2018-01-28 NOTE — Transfer of Care (Signed)
Immediate Anesthesia Transfer of Care Note  Patient: Victoria Mccormick  Procedure(s) Performed: MICROLARYNGOSCOPY with laser balloon dilation (N/A )  Patient Location: PACU  Anesthesia Type:General  Level of Consciousness: sedated  Airway & Oxygen Therapy: Patient Spontanous Breathing and Patient connected to face mask oxygen  Post-op Assessment: Report given to RN and Post -op Vital signs reviewed and stable  Post vital signs: Reviewed and stable  Last Vitals:  Vitals Value Taken Time  BP 157/76 01/28/2018  8:26 AM  Temp 36.9 C 01/28/2018  8:26 AM  Pulse 73 01/28/2018  8:29 AM  Resp 10 01/28/2018  8:29 AM  SpO2 100 % 01/28/2018  8:29 AM  Vitals shown include unvalidated device data.  Last Pain:  Vitals:   01/28/18 0655  TempSrc: Oral  PainSc: 0-No pain         Complications: No apparent anesthesia complications

## 2018-01-28 NOTE — Anesthesia Postprocedure Evaluation (Signed)
Anesthesia Post Note  Patient: Victoria Mccormick  Procedure(s) Performed: MICROLARYNGOSCOPY with laser balloon dilation (N/A )     Patient location during evaluation: PACU Anesthesia Type: General Level of consciousness: awake and alert Pain management: pain level controlled Vital Signs Assessment: post-procedure vital signs reviewed and stable Respiratory status: spontaneous breathing, nonlabored ventilation, respiratory function stable and patient connected to nasal cannula oxygen Cardiovascular status: blood pressure returned to baseline and stable Postop Assessment: no apparent nausea or vomiting Anesthetic complications: no    Last Vitals:  Vitals:   01/28/18 0845 01/28/18 0858  BP: (!) 148/87 (!) 161/75  Pulse: 65 62  Resp: 14 13  Temp:    SpO2: 100% 99%    Last Pain:  Vitals:   01/28/18 0858  TempSrc:   PainSc: 2                  Carisha Kantor

## 2018-01-28 NOTE — Anesthesia Procedure Notes (Signed)
Procedure Name: Intubation Date/Time: 01/28/2018 7:42 AM Performed by: Maryella Shivers, CRNA Pre-anesthesia Checklist: Patient identified, Emergency Drugs available, Suction available and Patient being monitored Patient Re-evaluated:Patient Re-evaluated prior to induction Oxygen Delivery Method: Circle system utilized Preoxygenation: Pre-oxygenation with 100% oxygen Induction Type: IV induction Ventilation: Mask ventilation without difficulty Laryngoscope Size: Glidescope and 4 Grade View: Grade I Tube type: Oral Tube size: 5.0 mm Number of attempts: 1 Airway Equipment and Method: Stylet and Oral airway Placement Confirmation: ETT inserted through vocal cords under direct vision,  positive ETCO2 and breath sounds checked- equal and bilateral Secured at: 20 cm Tube secured with: Tape Dental Injury: Teeth and Oropharynx as per pre-operative assessment

## 2018-01-29 ENCOUNTER — Encounter (HOSPITAL_BASED_OUTPATIENT_CLINIC_OR_DEPARTMENT_OTHER): Payer: Self-pay | Admitting: Otolaryngology

## 2018-01-30 ENCOUNTER — Other Ambulatory Visit: Payer: Self-pay | Admitting: Family Medicine

## 2018-01-30 DIAGNOSIS — Z1231 Encounter for screening mammogram for malignant neoplasm of breast: Secondary | ICD-10-CM

## 2018-03-18 ENCOUNTER — Ambulatory Visit
Admission: RE | Admit: 2018-03-18 | Discharge: 2018-03-18 | Disposition: A | Discharge status: Home / Self Care | Payer: Medicare Other | Source: Ambulatory Visit | Attending: Family Medicine | Admitting: Family Medicine

## 2018-03-18 DIAGNOSIS — Z1231 Encounter for screening mammogram for malignant neoplasm of breast: Secondary | ICD-10-CM

## 2018-07-12 NOTE — H&P (Signed)
Victoria Mccormick is an 68 y.o. female.   Chief Complaint: Stridor HPI: Long history of subglottic stenosis, having worsening stridor and exercise intolerance.  Past Medical History:  Diagnosis Date  . Arthritis   . 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 Complication of anesthesia    generalized muscle weakness after anesthesia for many days  . Diabetes mellitus   . GERD (gastroesophageal reflux disease)   . Hyperlipemia   . Hypertension   . Subglottic stenosis   . Wears glasses    reading    Past Surgical History:  Procedure Laterality Date  . CESAREAN SECTION     x2  . DIRECT LARYNGOSCOPY     glottic stenosis dilated-multiple times-2003to -2013 #8  . KNEE ARTHROSCOPY Left 12/2015  . MICROLARYNGOSCOPY WITH DILATION N/A 09/01/2016   Procedure: MICROLARYNGOSCOPY WITH BALLOON DILATION;  Surgeon: [name], MD;  Location: [place];  Service: ENT;  Laterality: N/A;  . MICROLARYNGOSCOPY WITH LASER AND BALLOON DILATION N/A 08/15/2012   Procedure: MICROLARYNGOSCOPY WITH LASER AND BALLOON DILATION;  Surgeon: [name], MD;  Location: [place];  Service: ENT;  Laterality: N/A;  Tracheal Dilation  . MICROLARYNGOSCOPY WITH LASER AND BALLOON DILATION N/A 01/31/2013   Procedure: MICROLARYNGOSCOPY WITH LASER AND BALLOON DILATION;  Surgeon: [name], MD;  Location: [place];  Service: ENT;  Laterality: N/A;  . MICROLARYNGOSCOPY WITH LASER AND BALLOON DILATION N/A 08/18/2013   Procedure: MICROLARYNGOSCOPY WITH LASER AND BALLOON DILATION;  Surgeon: [name], MD;  Location: [place];  Service: ENT;  Laterality: N/A;  . MICROLARYNGOSCOPY WITH LASER AND BALLOON DILATION N/A 08/27/2014   Procedure: MICROLARYNGOSCOPY WITH LASER AND BALLOON DILATION;  Surgeon: [name], MD;  Location: [place];  Service: ENT;  Laterality: N/A;  . MICROLARYNGOSCOPY WITH LASER  AND BALLOON DILATION N/A 07/15/2015   Procedure: MICROLARYNGOSCOPY WITH LASER AND BALLOON DILATION;  Surgeon: [name], MD;  Location: [place];  Service: ENT;  Laterality: N/A;  . MICROLARYNGOSCOPY WITH LASER AND BALLOON DILATION N/A 01/28/2016   Procedure: MICROLARYNGOSCOPY WITH LASER AND BALLOON DILATION;  Surgeon: [name], MD;  Location: [place];  Service: ENT;  Laterality: N/A;  . MICROLARYNGOSCOPY WITH LASER AND BALLOON DILATION N/A 05/18/2017   Procedure: MICROLARYNGOSCOPY WITH LASER AND BALLOON DILATION;  Surgeon: [name], MD;  Location: [place];  Service: ENT;  Laterality: N/A;  . MICROLARYNGOSCOPY WITH LASER AND BALLOON DILATION N/A 01/28/2018   Procedure: MICROLARYNGOSCOPY with laser balloon dilation;  Surgeon: [name], MD;  Location: [place];  Service: ENT;  Laterality: N/A;    No family history on file. Social History:  reports that she has quit smoking. She has never used smokeless tobacco. She reports current alcohol use. She reports that she does not use drugs.  Allergies: No Known Allergies  No medications prior to admission.    No results found for this or any previous visit (from the past 48 hour(s)). No results found.  ROS: otherwise negative  There were no vitals taken for this visit.  PHYSICAL EXAM: Overall appearance:  Healthy appearing, inspiratory stridor present. Head:  Normocephalic, atraumatic. Ears: External auditory canals are clear; tympanic membranes are intact and the middle ears are free of any effusion. Nose: External nose is healthy in appearance. Internal nasal exam free of any lesions or obstruction. Oral Cavity/pharynx:  There are no mucosal lesions  or masses identified. Hypopharynx/Larynx: no signs of any mucosal lesions or masses identified. Vocal cords move normally. Neuro:  No identifiable neurologic deficits. Neck: No palpable neck masses.  Studies  Reviewed: none    Assessment/Plan Revision microlaryngoscopy with laser excision and balloon dilation of subglottic stenosis  [name] 07/12/2018, 2:03 PM

## 2018-07-12 NOTE — H&P (View-Only) (Signed)
Victoria Mccormick is an 68 y.o. female.   Chief Complaint: Stridor HPI: Long history of subglottic stenosis, having worsening stridor and exercise intolerance.  Past Medical History:  Diagnosis Date  . Arthritis   . 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 Complication of anesthesia    generalized muscle weakness after anesthesia for many days  . Diabetes mellitus   . GERD (gastroesophageal reflux disease)   . Hyperlipemia   . Hypertension   . Subglottic stenosis   . Wears glasses    reading    Past Surgical History:  Procedure Laterality Date  . CESAREAN SECTION     x2  . DIRECT LARYNGOSCOPY     glottic stenosis dilated-multiple times-2003to -2013 #8  . KNEE ARTHROSCOPY Left 12/2015  . MICROLARYNGOSCOPY WITH DILATION N/A 09/01/2016   Procedure: MICROLARYNGOSCOPY WITH BALLOON DILATION;  Surgeon: [name], MD;  Location: [place];  Service: ENT;  Laterality: N/A;  . MICROLARYNGOSCOPY WITH LASER AND BALLOON DILATION N/A 08/15/2012   Procedure: MICROLARYNGOSCOPY WITH LASER AND BALLOON DILATION;  Surgeon: [name], MD;  Location: [place];  Service: ENT;  Laterality: N/A;  Tracheal Dilation  . MICROLARYNGOSCOPY WITH LASER AND BALLOON DILATION N/A 01/31/2013   Procedure: MICROLARYNGOSCOPY WITH LASER AND BALLOON DILATION;  Surgeon: [name], MD;  Location: [place];  Service: ENT;  Laterality: N/A;  . MICROLARYNGOSCOPY WITH LASER AND BALLOON DILATION N/A 08/18/2013   Procedure: MICROLARYNGOSCOPY WITH LASER AND BALLOON DILATION;  Surgeon: [name], MD;  Location: [place];  Service: ENT;  Laterality: N/A;  . MICROLARYNGOSCOPY WITH LASER AND BALLOON DILATION N/A 08/27/2014   Procedure: MICROLARYNGOSCOPY WITH LASER AND BALLOON DILATION;  Surgeon: [name], MD;  Location: [place];  Service: ENT;  Laterality: N/A;  . MICROLARYNGOSCOPY WITH LASER  AND BALLOON DILATION N/A 07/15/2015   Procedure: MICROLARYNGOSCOPY WITH LASER AND BALLOON DILATION;  Surgeon: [name], MD;  Location: [place];  Service: ENT;  Laterality: N/A;  . MICROLARYNGOSCOPY WITH LASER AND BALLOON DILATION N/A 01/28/2016   Procedure: MICROLARYNGOSCOPY WITH LASER AND BALLOON DILATION;  Surgeon: [name], MD;  Location: [place];  Service: ENT;  Laterality: N/A;  . MICROLARYNGOSCOPY WITH LASER AND BALLOON DILATION N/A 05/18/2017   Procedure: MICROLARYNGOSCOPY WITH LASER AND BALLOON DILATION;  Surgeon: [name], MD;  Location: [place];  Service: ENT;  Laterality: N/A;  . MICROLARYNGOSCOPY WITH LASER AND BALLOON DILATION N/A 01/28/2018   Procedure: MICROLARYNGOSCOPY with laser balloon dilation;  Surgeon: [name], MD;  Location: [place];  Service: ENT;  Laterality: N/A;    No family history on file. Social History:  reports that she has quit smoking. She has never used smokeless tobacco. She reports current alcohol use. She reports that she does not use drugs.  Allergies: No Known Allergies  No medications prior to admission.    No results found for this or any previous visit (from the past 48 hour(s)). No results found.  ROS: otherwise negative  There were no vitals taken for this visit.  PHYSICAL EXAM: Overall appearance:  Healthy appearing, inspiratory stridor present. Head:  Normocephalic, atraumatic. Ears: External auditory canals are clear; tympanic membranes are intact and the middle ears are free of any effusion. Nose: External nose is healthy in appearance. Internal nasal exam free of any lesions or obstruction. Oral Cavity/pharynx:  There are no mucosal lesions  or masses identified. Hypopharynx/Larynx: no signs of any mucosal lesions or masses identified. Vocal cords move normally. Neuro:  No identifiable neurologic deficits. Neck: No palpable neck masses.  Studies  Reviewed: none    Assessment/Plan Revision microlaryngoscopy with laser excision and balloon dilation of subglottic stenosis  [name] 07/12/2018, 2:03 PM

## 2018-07-19 ENCOUNTER — Ambulatory Visit: Admit: 2018-07-19 | Payer: Medicare Other | Admitting: Otolaryngology

## 2018-07-19 SURGERY — MICROLARYNGOSCOPY
Anesthesia: General

## 2018-07-25 NOTE — Pre-Procedure Instructions (Signed)
Victoria Mccormick  07/25/2018      [facility] DR[facility] STORE #[id number] [place], [place] AT [facility] OF [address] [place] [address] Phone: [phone] Fax: [phone]    Your procedure is scheduled on Wednesday from 0941-1037am.  Report to [facility] Admitting ,Entrance A at [phone].  Call this number if you have problems the morning of surgery:  [phone]   Remember:  Do not eat or drink after midnight.     Take these medicines the morning of surgery with A SIP OF WATER   Metoprolol succinate (TOPROL-XL)            Simvastatin (ZOCOR)  7 days prior to surgery STOP taking any Aspirin (unless otherwise instructed by your surgeon), Aleve, Naproxen, Ibuprofen, Motrin, Advil, Goody's, BC's, all herbal medications, fish oil, and all vitamins.  WHAT DO I DO ABOUT MY DIABETES MEDICATION?  Marland Kitchen Do not take METFORMIN and JANUVIA the morning of surgery.  . THE DAY BEFORE SURGERY, take  Glimepiride (AMARYL) in the morning. Do not take it the morning of surgery.      How to Manage Your Diabetes Before and After Surgery  Why is it important to control my blood sugar before and after surgery? . Improving blood sugar levels before and after surgery helps healing and can limit problems. . A way of improving blood sugar control is eating a healthy diet by: o  Eating less sugar and carbohydrates o  Increasing activity/exercise o  Talking with your doctor about reaching your blood sugar goals . High blood sugars (greater than 180 mg/dL) can raise your risk of infections and slow your recovery, so you will need to focus on controlling your diabetes during the weeks before surgery. . Make sure that the doctor who takes care of your diabetes knows about your planned surgery including the date and location.  How do I manage my blood sugar before surgery? . Check your blood sugar at least 4 times a day, starting 2 days before  surgery, to make sure that the level is not too high or low. o Check your blood sugar the morning of your surgery when you wake up and every 2 hours until you get to the Short Stay unit. . If your blood sugar is less than 70 mg/dL, you will need to treat for low blood sugar: o Do not take insulin. o Treat a low blood sugar (less than 70 mg/dL) with  cup of clear juice (cranberry or apple), 4 glucose tablets, OR glucose gel. o Recheck blood sugar in 15 minutes after treatment (to make sure it is greater than 70 mg/dL). If your blood sugar is not greater than 70 mg/dL on recheck, call [phone] for further instructions. . Report your blood sugar to the short stay nurse when you get to Short Stay.  . If you are admitted to the hospital after surgery: o Your blood sugar will be checked by the staff and you will probably be given insulin after surgery (instead of oral diabetes medicines) to make sure you have good blood sugar levels. o The goal for blood sugar control after surgery is 80-180 mg/dL.    Do not wear jewelry, make-up or nail polish.  Do not wear lotions, powders, or perfumes, or deodorant.  Do not shave 48 hours prior to surgery.  Men may shave face and neck.  Do not bring valuables to the hospital.  [facility]  is not responsible for any belongings or valuables.  Contacts, dentures or bridgework may not be worn into surgery.  Leave your suitcase in the car.  After surgery it may be brought to your room.  For patients admitted to the hospital, discharge time will be determined by your treatment team.  Patients discharged the day of surgery will not be allowed to drive home.   Special instructions:  [facility]- Preparing For Surgery  Before surgery, you can play an important role. Because skin is not sterile, your skin needs to be as free of germs as possible. You can reduce the number of germs on your skin by washing with CHG (chlorahexidine gluconate) Soap before surgery.   CHG is an antiseptic cleaner which kills germs and bonds with the skin to continue killing germs even after washing.    Oral Hygiene is also important to reduce your risk of infection.  Remember - BRUSH YOUR TEETH THE MORNING OF SURGERY WITH YOUR REGULAR TOOTHPASTE  Please do not use if you have an allergy to CHG or antibacterial soaps. If your skin becomes reddened/irritated stop using the CHG.  Do not shave (including legs and underarms) for at least 48 hours prior to first CHG shower. It is OK to shave your face.  Please follow these instructions carefully.   1. Shower the NIGHT BEFORE SURGERY and the MORNING OF SURGERY with CHG.   2. If you chose to wash your hair, wash your hair first as usual with your normal shampoo.  3. After you shampoo, rinse your hair and body thoroughly to remove the shampoo.  4. Use CHG as you would any other liquid soap. You can apply CHG directly to the skin and wash gently with a scrungie or a clean washcloth.   5. Apply the CHG Soap to your body ONLY FROM THE NECK DOWN.  Do not use on open wounds or open sores. Avoid contact with your eyes, ears, mouth and genitals (private parts). Wash Face and genitals (private parts)  with your normal soap.  6. Wash thoroughly, paying special attention to the area where your surgery will be performed.  7. Thoroughly rinse your body with warm water from the neck down.  8. DO NOT shower/wash with your normal soap after using and rinsing off the CHG Soap.  9. Pat yourself dry with a CLEAN TOWEL.  10. Wear CLEAN PAJAMAS to bed the night before surgery, wear comfortable clothes the morning of surgery  11. Place CLEAN SHEETS on your bed the night of your first shower and DO NOT SLEEP WITH PETS.  Day of Surgery:  Do not apply any deodorants/lotions.  Please wear clean clothes to the hospital/surgery center.   Remember to brush your teeth WITH YOUR REGULAR TOOTHPASTE.  Please read over the following fact sheets that  you were given. Pain Booklet, Coughing and Deep Breathing and Surgical Site Infection Prevention

## 2018-07-26 ENCOUNTER — Encounter (HOSPITAL_COMMUNITY): Payer: Self-pay

## 2018-07-26 ENCOUNTER — Encounter (HOSPITAL_COMMUNITY)
Admission: RE | Admit: 2018-07-26 | Discharge: 2018-07-26 | Disposition: A | Discharge status: Home / Self Care | Payer: Medicare Other | Source: Ambulatory Visit | Attending: Otolaryngology | Admitting: Otolaryngology

## 2018-07-26 ENCOUNTER — Other Ambulatory Visit (HOSPITAL_COMMUNITY)
Admission: RE | Admit: 2018-07-26 | Discharge: 2018-07-26 | Disposition: A | Discharge status: Home / Self Care | Payer: Medicare Other | Source: Ambulatory Visit | Attending: Otolaryngology | Admitting: Otolaryngology

## 2018-07-26 ENCOUNTER — Other Ambulatory Visit: Payer: Self-pay

## 2018-07-26 DIAGNOSIS — Z01818 Encounter for other preprocedural examination: Secondary | ICD-10-CM | POA: Insufficient documentation

## 2018-07-26 DIAGNOSIS — Z1159 Encounter for screening for other viral diseases: Secondary | ICD-10-CM | POA: Diagnosis not present

## 2018-07-26 LAB — CBC
HCT: 42.7 % (ref 36.0–46.0)
Hemoglobin: 13.9 g/dL (ref 12.0–15.0)
MCH: 27.3 pg (ref 26.0–34.0)
MCHC: 32.6 g/dL (ref 30.0–36.0)
MCV: 83.9 fL (ref 80.0–100.0)
Platelets: 336 10*3/uL (ref 150–400)
RBC: 5.09 MIL/uL (ref 3.87–5.11)
RDW: 13.4 % (ref 11.5–15.5)
WBC: 8.8 10*3/uL (ref 4.0–10.5)
nRBC: 0 % (ref 0.0–0.2)

## 2018-07-26 LAB — BASIC METABOLIC PANEL
Anion gap: 13 (ref 5–15)
BUN: 18 mg/dL (ref 8–23)
CO2: 28 mmol/L (ref 22–32)
Calcium: 9.7 mg/dL (ref 8.9–10.3)
Chloride: 98 mmol/L (ref 98–111)
Creatinine, Ser: 1.02 mg/dL — ABNORMAL HIGH (ref 0.44–1.00)
GFR calc Af Amer: >60 mL/min (ref >60)
GFR calc non Af Amer: 57 mL/min — ABNORMAL LOW (ref >60)
Glucose, Bld: 129 mg/dL — ABNORMAL HIGH (ref 70–99)
Potassium: 3.9 mmol/L (ref 3.5–5.1)
Sodium: 139 mmol/L (ref 135–145)

## 2018-07-26 LAB — HEMOGLOBIN A1C
Hgb A1c MFr Bld: 7.1 % — ABNORMAL HIGH (ref 4.8–5.6)
Mean Plasma Glucose: 157.07 mg/dL

## 2018-07-26 NOTE — Progress Notes (Signed)
PCP - Dr. Durene Cal  Cardiologist - Denies  Chest x-ray - Denies  EKG - 07/26/2018  Stress Test - Denies  ECHO - Denies  Cardiac Cath - Denies  AICD-na PM-na LOOP-na  Sleep Study - Denies CPAP - None  LABS- 07/26/2018: CBC, BMP, [place]  ASA-Denies  ERAS-No  HA1C- 07/26/2018 Fasting Blood Sugar - 80-102 Checks Blood Sugar __3___ times a week  Anesthesia- No  Pt denies having chest pain, sob, or fever at this time. All instructions explained to the pt, with a verbal understanding of the material. Pt agrees to go over the instructions while at home for a better understanding. The opportunity to ask questions was provided.   Coronavirus Screening  Have you experienced the following symptoms:  Cough yes/no: No Fever (>100.15F)  yes/no: No Runny nose yes/no: No Sore throat yes/no: No Difficulty breathing/shortness of breath  yes/no: No  Have you or a family member traveled in the last 14 days and where? yes/no: No   If the patient indicates "YES" to the above questions, their PAT will be rescheduled to limit the exposure to others and, the surgeon will be notified. THE PATIENT WILL NEED TO BE ASYMPTOMATIC FOR 14 DAYS.   If the patient is not experiencing any of these symptoms, the PAT nurse will instruct them to NOT bring anyone with them to their appointment since they may have these symptoms or traveled as well.   Please remind your patients and families that hospital visitation restrictions are in effect and the importance of the restrictions.

## 2018-07-26 NOTE — Progress Notes (Addendum)
Victoria Mccormick            07/26/2018                          [facility] DR[facility] STORE #[id number] [place], [place] AT [address] [place] [address] Phone: [phone] Fax: [phone]              Your procedure is scheduled on Wed., Jul 31, 2018 from 9:41AM-10:37AM            Report to [facility] Entrance "A" at 7:40AM            Call this number if you have problems the morning of surgery:            [phone]             Remember:            Do not eat or drink after midnight.                                   Take these medicines the morning of surgery with A SIP OF WATER             Metoprolol succinate (TOPROL-XL)            Simvastatin (ZOCOR)  As of today, stop taking any Aspirin (unless otherwise instructed by your surgeon), Aleve, Naproxen, Ibuprofen, Motrin, Advil, Goody's, BC's, all herbal medications, fish oil, and all vitamins.    Do not take Glimepiride (AMARYL), MetFORMIN (GLUCOPHAGE-XR), and SitaGLIPtin (JANUVIA) the morning of surgery.                            HOW TO MANAGE YOUR DIABETES BEFORE AND AFTER SURGERY  Why is it important to control my blood sugar before and after surgery?  Improving blood sugar levels before and after surgery helps healing and can limit problems.  A way of improving blood sugar control is eating a healthy diet by: ?  Eating less sugar and carbohydrates ?  Increasing activity/exercise ?  Talking with your doctor about reaching your blood sugar goals  High blood sugars (greater than 180 mg/dL) can raise your risk of infections and slow your recovery, so you will need to focus on controlling your diabetes during the weeks before surgery.  Make sure that the doctor who takes care of your diabetes knows about your planned surgery including the date and location.  How do I manage my blood sugar before surgery?  Check your blood sugar at least 4  times a day, starting 2 days before surgery, to make sure that the level is not too high or low. ? Check your blood sugar the morning of your surgery when you wake up and every 2 hours until you get to the Short Stay unit.  If your blood sugar is less than 70 mg/dL, you will need to treat for low blood sugar: ? Do not take insulin. ? Treat a low blood sugar (less than 70 mg/dL) with  cup of clear juice (cranberry or apple), 4glucose tablets, OR glucose gel. ? Recheck blood sugar in 15 minutes after treatment (to make sure it is greater than 70 mg/dL). If your blood sugar is not greater than 70 mg/dL on recheck, call [phone]  for further instructions.  Report your blood sugar to the short stay nurse when you get to Short Stay.   If you are admitted to the hospital after surgery: ? Your blood sugar will be checked by the staff and you will probably be given insulin after surgery (instead of oral diabetes medicines) to make sure you have good blood sugar levels. ? The goal for blood sugar control after surgery is 80-180 mg/dL.              Do not wear jewelry, make-up or nail polish.            Do not wear lotions, powders, or perfumes, or deodorant.            Do not shave 48 hours prior to surgery.              Do not bring valuables to the hospital.            [facility] is not responsible for any belongings or valuables.  Contacts, dentures or bridgework may not be worn into surgery.  Leave your suitcase in the car.  After surgery it may be brought to your room.  For patients admitted to the hospital, discharge time will be determined by your treatment team.  Patients discharged the day of surgery will not be allowed to drive home.   Special instructions:  [place]- Preparing For Surgery  Before surgery, you can play an important role. Because skin is not sterile, your skin needs to be as free of germs as possible. You can reduce the number of germs on your skin by  washing with CHG (chlorahexidine gluconate) Soap before surgery.  CHG is an antiseptic cleaner which kills germs and bonds with the skin to continue killing germs even after washing.    Oral Hygiene is also important to reduce your risk of infection.  Remember - BRUSH YOUR TEETH THE MORNING OF SURGERY WITH YOUR REGULAR TOOTHPASTE  Please do not use if you have an allergy to CHG or antibacterial soaps. If your skin becomes reddened/irritated stop using the CHG.  Do not shave (including legs and underarms) for at least 48 hours prior to first CHG shower. It is OK to shave your face.  Please follow these instructions carefully.                                                                                                                     1. Shower the NIGHT BEFORE SURGERY and the MORNING OF SURGERY with CHG.   2. If you chose to wash your hair, wash your hair first as usual with your normal shampoo.  3. After you shampoo, rinse your hair and body thoroughly to remove the shampoo.  4. Use CHG as you would any other liquid soap. You can apply CHG directly to the skin and wash gently with a scrungie or a clean washcloth.   5. Apply the CHG Soap to your body ONLY FROM THE NECK  DOWN.  Do not use on open wounds or open sores. Avoid contact with your eyes, ears, mouth and genitals (private parts). Wash Face and genitals (private parts)  with your normal soap.  6. Wash thoroughly, paying special attention to the area where your surgery will be performed.  7. Thoroughly rinse your body with warm water from the neck down.  8. DO NOT shower/wash with your normal soap after using and rinsing off the CHG Soap.  9. Pat yourself dry with a CLEAN TOWEL.  10. Wear CLEAN PAJAMAS to bed the night before surgery, wear comfortable clothes the morning of surgery  11. Place CLEAN SHEETS on your bed the night of your first shower and DO NOT SLEEP WITH PETS.  Day of Surgery:  Do not apply any  deodorants/lotions.  Please wear clean clothes to the hospital/surgery center.   Remember to brush your teeth WITH YOUR REGULAR TOOTHPASTE.  Please read over the following fact sheets that you were given. Pain Booklet, Coughing and Deep Breathing and Surgical Site Infection Prevention

## 2018-07-28 LAB — NOVEL CORONAVIRUS, NAA (HOSP ORDER, SEND-OUT TO REF LAB; TAT 18-24 HRS): SARS-CoV-2, NAA: NOT DETECTED

## 2018-07-31 ENCOUNTER — Encounter (HOSPITAL_COMMUNITY)
Admission: RE | Disposition: A | Discharge status: Home / Self Care | Payer: Self-pay | Source: Home / Self Care | Attending: Otolaryngology

## 2018-07-31 ENCOUNTER — Ambulatory Visit (HOSPITAL_COMMUNITY)
Admission: RE | Admit: 2018-07-31 | Discharge: 2018-07-31 | Disposition: A | Discharge status: Home / Self Care | Payer: Medicare Other | Attending: Otolaryngology | Admitting: Otolaryngology

## 2018-07-31 ENCOUNTER — Ambulatory Visit (HOSPITAL_COMMUNITY): Payer: Medicare Other | Admitting: Anesthesiology

## 2018-07-31 ENCOUNTER — Ambulatory Visit (HOSPITAL_COMMUNITY): Payer: Medicare Other | Admitting: Vascular Surgery

## 2018-07-31 ENCOUNTER — Other Ambulatory Visit: Payer: Self-pay

## 2018-07-31 ENCOUNTER — Encounter (HOSPITAL_COMMUNITY): Payer: Self-pay

## 2018-07-31 DIAGNOSIS — E785 Hyperlipidemia, unspecified: Secondary | ICD-10-CM | POA: Diagnosis not present

## 2018-07-31 DIAGNOSIS — Z87891 Personal history of nicotine dependence: Secondary | ICD-10-CM | POA: Insufficient documentation

## 2018-07-31 DIAGNOSIS — E119 Type 2 diabetes mellitus without complications: Secondary | ICD-10-CM | POA: Diagnosis not present

## 2018-07-31 DIAGNOSIS — M1990 Unspecified osteoarthritis, unspecified site: Secondary | ICD-10-CM | POA: Diagnosis not present

## 2018-07-31 DIAGNOSIS — I10 Essential (primary) hypertension: Secondary | ICD-10-CM | POA: Diagnosis not present

## 2018-07-31 DIAGNOSIS — Z79899 Other long term (current) drug therapy: Secondary | ICD-10-CM | POA: Diagnosis not present

## 2018-07-31 DIAGNOSIS — J386 Stenosis of larynx: Secondary | ICD-10-CM | POA: Diagnosis present

## 2018-07-31 DIAGNOSIS — Z7984 Long term (current) use of oral hypoglycemic drugs: Secondary | ICD-10-CM | POA: Insufficient documentation

## 2018-07-31 HISTORY — PX: MICROLARYNGOSCOPY WITH LASER AND BALLOON DILATION: SHX5973

## 2018-07-31 LAB — GLUCOSE, CAPILLARY
Glucose-Capillary: 171 mg/dL — ABNORMAL HIGH (ref 70–99)
Glucose-Capillary: 139 mg/dL — ABNORMAL HIGH (ref 70–99)
Glucose-Capillary: 159 mg/dL — ABNORMAL HIGH (ref 70–99)

## 2018-07-31 SURGERY — MICROLARYNGOSCOPY WITH LASER AND BALLOON DILATION
Anesthesia: General | Site: Mouth

## 2018-07-31 MED ORDER — SUGAMMADEX SODIUM 200 MG/2ML IV SOLN
INTRAVENOUS | Status: DC | PRN
  Administered 2018-07-31: 200 mg via INTRAVENOUS

## 2018-07-31 MED ORDER — ESMOLOL HCL 100 MG/10ML IV SOLN
INTRAVENOUS | Status: DC | PRN
  Administered 2018-07-31 (×5): 20 mg via INTRAVENOUS

## 2018-07-31 MED ORDER — FENTANYL CITRATE (PF) 250 MCG/5ML IJ SOLN
INTRAMUSCULAR | Status: DC | PRN
  Administered 2018-07-31: 100 ug via INTRAVENOUS

## 2018-07-31 MED ORDER — DEXAMETHASONE SODIUM PHOSPHATE 10 MG/ML IJ SOLN
INTRAMUSCULAR | Status: AC
  Filled 2018-07-31: qty 1

## 2018-07-31 MED ORDER — PROPOFOL 10 MG/ML IV BOLUS
INTRAVENOUS | Status: DC | PRN
  Administered 2018-07-31 (×2): 20 mg via INTRAVENOUS
  Administered 2018-07-31: 30 mg via INTRAVENOUS
  Administered 2018-07-31: 120 mg via INTRAVENOUS

## 2018-07-31 MED ORDER — ESMOLOL HCL 100 MG/10ML IV SOLN
INTRAVENOUS | Status: AC
  Filled 2018-07-31: qty 10

## 2018-07-31 MED ORDER — EPINEPHRINE HCL (NASAL) 0.1 % NA SOLN
NASAL | Status: AC
  Filled 2018-07-31: qty 30

## 2018-07-31 MED ORDER — ROCURONIUM BROMIDE 10 MG/ML (PF) SYRINGE
PREFILLED_SYRINGE | INTRAVENOUS | Status: AC
  Filled 2018-07-31: qty 10

## 2018-07-31 MED ORDER — SCOPOLAMINE 1 MG/3DAYS TD PT72: 1.0000 | MEDICATED_PATCH | Freq: Once | TRANSDERMAL | Status: DC

## 2018-07-31 MED ORDER — ARTIFICIAL TEARS OPHTHALMIC OINT
TOPICAL_OINTMENT | OPHTHALMIC | Status: AC
  Filled 2018-07-31: qty 3.5

## 2018-07-31 MED ORDER — DEXAMETHASONE SODIUM PHOSPHATE 10 MG/ML IJ SOLN
INTRAMUSCULAR | Status: DC | PRN
  Administered 2018-07-31: 10 mg via INTRAVENOUS

## 2018-07-31 MED ORDER — MIDAZOLAM HCL 2 MG/2ML IJ SOLN
INTRAMUSCULAR | Status: AC
  Filled 2018-07-31: qty 2

## 2018-07-31 MED ORDER — SODIUM CHLORIDE (PF) 0.9 % IJ SOLN
INTRAMUSCULAR | Status: AC
  Filled 2018-07-31: qty 10

## 2018-07-31 MED ORDER — ROCURONIUM BROMIDE 10 MG/ML (PF) SYRINGE
PREFILLED_SYRINGE | INTRAVENOUS | Status: DC | PRN
  Administered 2018-07-31: 70 mg via INTRAVENOUS

## 2018-07-31 MED ORDER — SUCCINYLCHOLINE CHLORIDE 200 MG/10ML IV SOSY
PREFILLED_SYRINGE | INTRAVENOUS | Status: AC
  Filled 2018-07-31: qty 10

## 2018-07-31 MED ORDER — PROPOFOL 1000 MG/100ML IV EMUL
INTRAVENOUS | Status: AC
  Filled 2018-07-31: qty 100

## 2018-07-31 MED ORDER — LIDOCAINE 2% (20 MG/ML) 5 ML SYRINGE
INTRAMUSCULAR | Status: DC | PRN
  Administered 2018-07-31: 100 mg via INTRAVENOUS

## 2018-07-31 MED ORDER — LIDOCAINE 2% (20 MG/ML) 5 ML SYRINGE
INTRAMUSCULAR | Status: AC
  Filled 2018-07-31: qty 5

## 2018-07-31 MED ORDER — PROPOFOL 500 MG/50ML IV EMUL
INTRAVENOUS | Status: DC | PRN
  Administered 2018-07-31: 100 ug/kg/min via INTRAVENOUS

## 2018-07-31 MED ORDER — LACTATED RINGERS IV SOLN
INTRAVENOUS | Status: DC
  Administered 2018-07-31 (×2): via INTRAVENOUS

## 2018-07-31 MED ORDER — MIDAZOLAM HCL 5 MG/5ML IJ SOLN
INTRAMUSCULAR | Status: DC | PRN
  Administered 2018-07-31: 2 mg via INTRAVENOUS

## 2018-07-31 MED ORDER — EPINEPHRINE PF 1 MG/ML IJ SOLN
INTRAMUSCULAR | Status: DC | PRN
  Administered 2018-07-31: 1 mg

## 2018-07-31 MED ORDER — ONDANSETRON HCL 4 MG/2ML IJ SOLN
INTRAMUSCULAR | Status: AC
  Filled 2018-07-31: qty 2

## 2018-07-31 MED ORDER — AMOXICILLIN 500 MG PO CAPS: 500.0000 mg | ORAL_CAPSULE | Freq: Three times a day (TID) | ORAL | 0 refills | Status: AC

## 2018-07-31 MED ORDER — FENTANYL CITRATE (PF) 250 MCG/5ML IJ SOLN
INTRAMUSCULAR | Status: AC
  Filled 2018-07-31: qty 5

## 2018-07-31 MED ORDER — ONDANSETRON HCL 4 MG/2ML IJ SOLN
INTRAMUSCULAR | Status: DC | PRN
  Administered 2018-07-31: 4 mg via INTRAVENOUS

## 2018-07-31 SURGICAL SUPPLY — 30 items
BALLN PULM 15 16.5 18X75 (BALLOONS) ×2
BALLN PULMONARY 10-12 (MISCELLANEOUS) ×1 IMPLANT
BALLN PULMONARY 8-10 OD75 (MISCELLANEOUS) IMPLANT
BALLOON DILATION PUL 12-15 3CM (BALLOONS) ×2 IMPLANT
BALLOON PULM 15 16.5 18X75 (BALLOONS) IMPLANT
BLADE SURG 15 STRL LF DISP TIS (BLADE) IMPLANT
BLADE SURG 15 STRL SS (BLADE)
CANISTER SUCTION 1200CC (MISCELLANEOUS) ×2 IMPLANT
COVER BACK TABLE 60X90IN (DRAPES) ×2 IMPLANT
COVER MAYO STAND STRL (DRAPES) ×2 IMPLANT
COVER WAND RF STERILE (DRAPES) ×2 IMPLANT
DRAPE HALF SHEET 40X57 (DRAPES) ×2 IMPLANT
GAUZE 4X4 16PLY RFD (DISPOSABLE) ×2 IMPLANT
GLOVE ECLIPSE 7.5 STRL STRAW (GLOVE) ×2 IMPLANT
GOWN STRL REUS W/ TWL LRG LVL3 (GOWN DISPOSABLE) ×2 IMPLANT
GOWN STRL REUS W/TWL LRG LVL3 (GOWN DISPOSABLE) ×4
GUARD TEETH (MISCELLANEOUS) ×1 IMPLANT
KIT BASIN OR (CUSTOM PROCEDURE TRAY) ×2 IMPLANT
NDL PRECISIONGLIDE 27X1.5 (NEEDLE) IMPLANT
NDL SPNL 22GX3.5 QUINCKE BK (NEEDLE) IMPLANT
NEEDLE PRECISIONGLIDE 27X1.5 (NEEDLE) IMPLANT
NEEDLE SPNL 22GX3.5 QUINCKE BK (NEEDLE) IMPLANT
NS IRRIG 1000ML POUR BTL (IV SOLUTION) ×2 IMPLANT
PAD EYE OVAL STERILE LF (GAUZE/BANDAGES/DRESSINGS) ×4 IMPLANT
PATTIES SURGICAL .5 X3 (DISPOSABLE) ×2 IMPLANT
SOLUTION ANTI FOG 6CC (MISCELLANEOUS) ×2 IMPLANT
SURGILUBE 2OZ TUBE FLIPTOP (MISCELLANEOUS) IMPLANT
SYR GAUGE ALLIANCE SINGLE USE (MISCELLANEOUS) ×1 IMPLANT
TOWEL OR 17X24 6PK STRL BLUE (TOWEL DISPOSABLE) ×2 IMPLANT
TUBE CONNECTING 12X1/4 (SUCTIONS) ×2 IMPLANT

## 2018-07-31 NOTE — Anesthesia Postprocedure Evaluation (Signed)
Anesthesia Post Note  Patient: Victoria Mccormick  Procedure(s) Performed: MICROLARYNGOSCOPY AND BALLOON DILATION (N/A Mouth)     Patient location during evaluation: PACU Anesthesia Type: General Level of consciousness: awake and alert, patient cooperative and oriented Pain management: pain level controlled Vital Signs Assessment: post-procedure vital signs reviewed and stable Respiratory status: spontaneous breathing, nonlabored ventilation and respiratory function stable Cardiovascular status: blood pressure returned to baseline and stable Postop Assessment: no apparent nausea or vomiting Anesthetic complications: no    Last Vitals:  Vitals:   07/31/18 1318 07/31/18 1330  BP: (!) 141/73 140/61  Pulse: 62 63  Resp: (!) 9 11  Temp: 36.6 C   SpO2: 97% 97%    Last Pain:  Vitals:   07/31/18 1318  TempSrc:   PainSc: 0-No pain                 Tyric Rodeheaver,E. Arwa Yero

## 2018-07-31 NOTE — Op Note (Signed)
OPERATIVE REPORT  DATE OF SURGERY: 07/31/2018  PATIENT:  Victoria Mccormick,  68 y.o. female  PRE-OPERATIVE DIAGNOSIS:  Subglottis Stenosis  POST-OPERATIVE DIAGNOSIS:  Subglottis Stenosis  PROCEDURE:  Procedure(s): MICROLARYNGOSCOPY AND BALLOON DILATION  SURGEON:  Beckie Salts, MD  ASSISTANTS: none  ANESTHESIA:   General   EBL: 20 ml  DRAINS: none  LOCAL MEDICATIONS USED:  None  SPECIMEN: None  COUNTS:  Correct  PROCEDURE DETAILS: The patient was taken to the operating room and placed on the operating table in the supine position.  The patient was placed under general anesthesia and then anesthesia attempted intubation using a glide scope.  The cords were easily visualized but it was very difficult to pass a 5 endotracheal tube through the subglottic stenosis adequately.  Ultimately a 4.5 tube was used with a stylette.  It was also difficult to get this past the stenotic segment.  A CRE pulmonary balloon dilator was used at this point and inflated up to 3 atm pressure which corresponds to 10 mm.  Then we were able to pass the 4.5 endotracheal tube through the stenotic segment, and the care of the patient was handed over to myself from anesthesia.  The table was then turned 90 degrees and the patient was draped in a standard fashion.  A Jako laryngoscope was used to visualize the larynx and secured to the Mayo stand with the suspension apparatus.  A maxillary tooth protector was used throughout the case.  I was able to remove the endotracheal tube and then replace it through the laryngoscope and did this multiple times throughout the case.  I was unable to get adequate exposure of the subglottic larynx in order to use the operating microscope due to the angle of the larynx and the airway constraints from her dentition and overall head and neck anatomy.  A 0 degree rod endoscope and a flexible fiberoptic laryngoscope were then used on multiple occasions to visualize the subglottic  airway.  The stenotic segment was identified.  It was approximately 2 cm below the cords.  This was serially dilated using the 2 different size CRE pulmonary balloon dilators.  The stenosis was serially dilated up to 15 mm.  The fiberoptic scope was used to visualize the stenosis sent to assure the positioning of the balloon dilator.  Following these maneuvers I was able to reintubate serially up to a #7 endotracheal tube.  The Jako laryngoscope was then removed.  Patient was handed back to anesthesia for awakening and extubation.  CO2 laser was not used due to the anatomic constraints and inability to position the microscope into view.Marland Kitchen      PATIENT DISPOSITION:  To PACU, stable

## 2018-07-31 NOTE — Anesthesia Procedure Notes (Signed)
Procedure Name: Intubation Date/Time: 07/31/2018 11:34 AM Performed by: Renato Shin, CRNA Pre-anesthesia Checklist: Patient identified, Emergency Drugs available, Suction available and Patient being monitored Patient Re-evaluated:Patient Re-evaluated prior to induction Oxygen Delivery Method: Circle system utilized Preoxygenation: Pre-oxygenation with 100% oxygen Induction Type: IV induction Laryngoscope Size: Glidescope and 3 Grade View: Grade I Tube type: Oral Tube size: 4.5 mm Number of attempts: 1 Airway Equipment and Method: Stylet,  Oral airway,  Video-laryngoscopy and Rigid stylet Placement Confirmation: ETT inserted through vocal cords under direct vision,  positive ETCO2 and breath sounds checked- equal and bilateral Tube secured with: Tape Dental Injury: Teeth and Oropharynx as per pre-operative assessment  Difficulty Due To: Difficulty was anticipated and Difficult Airway- due to anterior larynx Future Recommendations: Recommend- induction with short-acting agent, and alternative techniques readily available Comments: Pt known difficult intubation due subglottic stenosis. Uneventful induction. EZ view of glottic opening with Glidescope. 5.0 MLT unable to pass. 4.5 pediatric cuffed tube able to pass balloon far enough through cords to have +ETCO2, chest rise. VSS throughout. Dr Constance Holster pass dilator through ETT to dilate. After dilation, 4.5 ETT pass with ease. Drs Glennon Mac and Constance Holster manage ETT placement. VSS stable throughout, pt on propofol gtt.

## 2018-07-31 NOTE — Anesthesia Preprocedure Evaluation (Addendum)
Anesthesia Evaluation  Patient identified by MRN, date of birth, ID band Patient awake    Reviewed: Allergy & Precautions, NPO status , Patient's Chart, lab work & pertinent test results  History of Anesthesia Complications Negative for: history of anesthetic complications  Airway Mallampati: II  TM Distance: >3 FB Neck ROM: Full    Dental  (+) Dental Advisory Given   Pulmonary former smoker,  07/26/2018 SARS coronavirus NEG Subglottic stenosis   breath sounds clear to auscultation       Cardiovascular hypertension, Pt. on medications and Pt. on home beta blockers (-) angina Rhythm:Regular Rate:Normal     Neuro/Psych negative neurological ROS     GI/Hepatic Neg liver ROS, GERD  Medicated and Controlled,  Endo/Other  diabetes (glu 139), Oral Hypoglycemic Agents  Renal/GU negative Renal ROS     Musculoskeletal  (+) Arthritis ,   Abdominal   Peds  Hematology negative hematology ROS (+)   Anesthesia Other Findings   Reproductive/Obstetrics                            Anesthesia Physical Anesthesia Plan  ASA: III  Anesthesia Plan: General   Post-op Pain Management:    Induction: Intravenous  PONV Risk Score and Plan: 3 and Ondansetron, Dexamethasone and Scopolamine patch - Pre-op  Airway Management Planned: Oral ETT  Additional Equipment:   Intra-op Plan:   Post-operative Plan: Extubation in OR  Informed Consent: I have reviewed the patients History and Physical, chart, labs and discussed the procedure including the risks, benefits and alternatives for the proposed anesthesia with the patient or authorized representative who has indicated his/her understanding and acceptance.     Dental advisory given  Plan Discussed with: CRNA and Surgeon  Anesthesia Plan Comments:        Anesthesia Quick Evaluation

## 2018-07-31 NOTE — Transfer of Care (Signed)
Immediate Anesthesia Transfer of Care Note  Patient: Victoria Mccormick  Procedure(s) Performed: MICROLARYNGOSCOPY AND BALLOON DILATION (N/A Mouth)  Patient Location: PACU  Anesthesia Type:General  Level of Consciousness: awake and patient cooperative  Airway & Oxygen Therapy: Patient Spontanous Breathing and Patient connected to face mask oxygen  Post-op Assessment: Report given to RN and Post -op Vital signs reviewed and stable  Post vital signs: Reviewed and stable  Last Vitals:  Vitals Value Taken Time  BP 134/72 07/31/2018 12:48 PM  Temp    Pulse 60 07/31/2018 12:52 PM  Resp 11 07/31/2018 12:52 PM  SpO2 100 % 07/31/2018 12:52 PM  Vitals shown include unvalidated device data.  Last Pain:  Vitals:   07/31/18 0806  TempSrc:   PainSc: 0-No pain         Complications: No apparent anesthesia complications

## 2018-07-31 NOTE — Interval H&P Note (Signed)
History and Physical Interval Note:  07/31/2018 10:41 AM  Victoria Mccormick  has presented today for surgery, with the diagnosis of Subglottis Stenosis.  The various methods of treatment have been discussed with the patient and family. After consideration of risks, benefits and other options for treatment, the patient has consented to  Procedure(s): MICROLARYNGOSCOPY WITH CO2 LASER AND BALLOON DILATION (N/A) as a surgical intervention.  The patient's history has been reviewed, patient examined, no change in status, stable for surgery.  I have reviewed the patient's chart and labs.  Questions were answered to the patient's satisfaction.     [name]

## 2018-08-01 ENCOUNTER — Encounter (HOSPITAL_COMMUNITY): Payer: Self-pay | Admitting: Otolaryngology

## 2019-01-27 ENCOUNTER — Encounter (HOSPITAL_BASED_OUTPATIENT_CLINIC_OR_DEPARTMENT_OTHER): Payer: Self-pay | Admitting: *Deleted

## 2019-01-27 ENCOUNTER — Other Ambulatory Visit: Payer: Self-pay

## 2019-01-28 NOTE — H&P (Signed)
Victoria Mccormick is an 68 y.o. female.   Chief Complaint: Stridor HPI: Long history of subglottic stenosis, having worsening stridor and exercise intolerance.      Past Medical History:  Diagnosis Date  . Arthritis   . 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 Complication of anesthesia    generalized muscle weakness after anesthesia for many days  . Diabetes mellitus   . GERD (gastroesophageal reflux disease)   . Hyperlipemia   . Hypertension   . Subglottic stenosis   . Wears glasses    reading         Past Surgical History:  Procedure Laterality Date  . CESAREAN SECTION     x2  . DIRECT LARYNGOSCOPY     glottic stenosis dilated-multiple times-2003to -2013 #8  . KNEE ARTHROSCOPY Left 12/2015  . MICROLARYNGOSCOPY WITH DILATION N/A 09/01/2016   Procedure: MICROLARYNGOSCOPY WITH BALLOON DILATION;  Surgeon: [name], MD;  Location: [place];  Service: ENT;  Laterality: N/A;  . MICROLARYNGOSCOPY WITH LASER AND BALLOON DILATION N/A 08/15/2012   Procedure: MICROLARYNGOSCOPY WITH LASER AND BALLOON DILATION;  Surgeon: [name], MD;  Location: [place];  Service: ENT;  Laterality: N/A;  Tracheal Dilation  . MICROLARYNGOSCOPY WITH LASER AND BALLOON DILATION N/A 01/31/2013   Procedure: MICROLARYNGOSCOPY WITH LASER AND BALLOON DILATION;  Surgeon: [name], MD;  Location: [place];  Service: ENT;  Laterality: N/A;  . MICROLARYNGOSCOPY WITH LASER AND BALLOON DILATION N/A 08/18/2013   Procedure: MICROLARYNGOSCOPY WITH LASER AND BALLOON DILATION;  Surgeon: [name], MD;  Location: [place];  Service: ENT;  Laterality: N/A;  . MICROLARYNGOSCOPY WITH LASER AND BALLOON DILATION N/A 08/27/2014   Procedure: MICROLARYNGOSCOPY WITH LASER AND BALLOON DILATION;  Surgeon: [name], MD;  Location: [place];  Service: ENT;  Laterality:  N/A;  . MICROLARYNGOSCOPY WITH LASER AND BALLOON DILATION N/A 07/15/2015   Procedure: MICROLARYNGOSCOPY WITH LASER AND BALLOON DILATION;  Surgeon: [name], MD;  Location: [place];  Service: ENT;  Laterality: N/A;  . MICROLARYNGOSCOPY WITH LASER AND BALLOON DILATION N/A 01/28/2016   Procedure: MICROLARYNGOSCOPY WITH LASER AND BALLOON DILATION;  Surgeon: [name], MD;  Location: [place];  Service: ENT;  Laterality: N/A;  . MICROLARYNGOSCOPY WITH LASER AND BALLOON DILATION N/A 05/18/2017   Procedure: MICROLARYNGOSCOPY WITH LASER AND BALLOON DILATION;  Surgeon: [name], MD;  Location: [place];  Service: ENT;  Laterality: N/A;  . MICROLARYNGOSCOPY WITH LASER AND BALLOON DILATION N/A 01/28/2018   Procedure: MICROLARYNGOSCOPY with laser balloon dilation;  Surgeon: [name], MD;  Location: [place] [facility];  Service: ENT;  Laterality: N/A;    No family history on file. Social History:  reports that she has quit smoking. She has never used smokeless tobacco. She reports current alcohol use. She reports that she does not use drugs.  Allergies: No Known Allergies  No medications prior to admission.    Lab Results Last 48 Hours  No results found for this or any previous visit (from the past 48 hour(s)).   Imaging Results (Last 48 hours)  No results found.    ROS: otherwise negative  There were no vitals taken for this visit.  PHYSICAL EXAM: Overall appearance:  Healthy appearing, inspiratory stridor present. Head:  Normocephalic, atraumatic. Ears: External auditory canals are clear; tympanic membranes are intact and the middle ears are free of any  effusion. Nose: External nose is healthy in appearance. Internal nasal exam free of any lesions or obstruction. Oral Cavity/pharynx:  There are no mucosal lesions or masses identified. Hypopharynx/Larynx: no signs of any mucosal lesions or masses identified. Vocal  cords move normally. Neuro:  No identifiable neurologic deficits. Neck: No palpable neck masses.  Studies Reviewed: none    Assessment/Plan Revision microlaryngoscopy with laser excision and balloon dilation of subglottic stenosis

## 2019-01-30 ENCOUNTER — Other Ambulatory Visit (HOSPITAL_COMMUNITY)
Admission: RE | Admit: 2019-01-30 | Discharge: 2019-01-30 | Disposition: A | Discharge status: Home / Self Care | Payer: Medicare Other | Source: Ambulatory Visit | Attending: Otolaryngology | Admitting: Otolaryngology

## 2019-01-30 DIAGNOSIS — Z20828 Contact with and (suspected) exposure to other viral communicable diseases: Secondary | ICD-10-CM | POA: Diagnosis not present

## 2019-01-30 DIAGNOSIS — Z01812 Encounter for preprocedural laboratory examination: Secondary | ICD-10-CM | POA: Diagnosis present

## 2019-01-31 ENCOUNTER — Encounter (HOSPITAL_BASED_OUTPATIENT_CLINIC_OR_DEPARTMENT_OTHER): Payer: Self-pay

## 2019-01-31 LAB — NOVEL CORONAVIRUS, NAA (HOSP ORDER, SEND-OUT TO REF LAB; TAT 18-24 HRS): SARS-CoV-2, NAA: NOT DETECTED

## 2019-02-02 NOTE — Anesthesia Preprocedure Evaluation (Addendum)
Anesthesia Evaluation  Patient identified by MRN, date of birth, ID band Patient awake  General Assessment Comment:Hx of Myalgias after sux?  Reviewed: Allergy & Precautions, NPO status , Patient's Chart, lab work & pertinent test results  History of Anesthesia Complications (+) history of anesthetic complications  Airway Mallampati: II  TM Distance: >3 FB Neck ROM: Full    Dental no notable dental hx. (+) Teeth Intact, Dental Advisory Given   Pulmonary neg pulmonary ROS, former smoker,    Pulmonary exam normal breath sounds clear to auscultation       Cardiovascular hypertension, Pt. on medications Normal cardiovascular exam Rhythm:Regular Rate:Normal     Neuro/Psych negative neurological ROS  negative psych ROS   GI/Hepatic Neg liver ROS,   Endo/Other  diabetes, Type 2  Renal/GU negative Renal ROS     Musculoskeletal  (+) Arthritis ,   Abdominal   Peds  Hematology   Anesthesia Other Findings   Reproductive/Obstetrics                           Anesthesia Physical Anesthesia Plan  ASA: II  Anesthesia Plan: General   Post-op Pain Management:    Induction: Intravenous  PONV Risk Score and Plan: 3 and Treatment may vary due to age or medical condition, Ondansetron and Dexamethasone  Airway Management Planned: Oral ETT and Video Laryngoscope Planned  Additional Equipment: None  Intra-op Plan:   Post-operative Plan: Extubation in OR  Informed Consent: I have reviewed the patients History and Physical, chart, labs and discussed the procedure including the risks, benefits and alternatives for the proposed anesthesia with the patient or authorized representative who has indicated his/her understanding and acceptance.     Dental advisory given  Plan Discussed with: CRNA  Anesthesia Plan Comments: (Will use rocuronium as relaxant)     Anesthesia Quick Evaluation

## 2019-02-03 ENCOUNTER — Ambulatory Visit (HOSPITAL_BASED_OUTPATIENT_CLINIC_OR_DEPARTMENT_OTHER): Payer: Medicare Other | Admitting: Anesthesiology

## 2019-02-03 ENCOUNTER — Ambulatory Visit (HOSPITAL_BASED_OUTPATIENT_CLINIC_OR_DEPARTMENT_OTHER)
Admission: RE | Admit: 2019-02-03 | Discharge: 2019-02-03 | Disposition: A | Discharge status: Home / Self Care | Payer: Medicare Other | Attending: Otolaryngology | Admitting: Otolaryngology

## 2019-02-03 ENCOUNTER — Encounter (HOSPITAL_BASED_OUTPATIENT_CLINIC_OR_DEPARTMENT_OTHER)
Admission: RE | Disposition: A | Discharge status: Home / Self Care | Payer: Self-pay | Source: Home / Self Care | Attending: Otolaryngology

## 2019-02-03 ENCOUNTER — Encounter (HOSPITAL_BASED_OUTPATIENT_CLINIC_OR_DEPARTMENT_OTHER): Payer: Self-pay | Admitting: *Deleted

## 2019-02-03 ENCOUNTER — Other Ambulatory Visit: Payer: Self-pay

## 2019-02-03 DIAGNOSIS — Z87891 Personal history of nicotine dependence: Secondary | ICD-10-CM | POA: Insufficient documentation

## 2019-02-03 DIAGNOSIS — I10 Essential (primary) hypertension: Secondary | ICD-10-CM | POA: Insufficient documentation

## 2019-02-03 DIAGNOSIS — E119 Type 2 diabetes mellitus without complications: Secondary | ICD-10-CM | POA: Insufficient documentation

## 2019-02-03 DIAGNOSIS — J386 Stenosis of larynx: Secondary | ICD-10-CM | POA: Insufficient documentation

## 2019-02-03 DIAGNOSIS — R061 Stridor: Secondary | ICD-10-CM | POA: Diagnosis present

## 2019-02-03 HISTORY — PX: MICROLARYNGOSCOPY WITH LASER AND BALLOON DILATION: SHX5973

## 2019-02-03 LAB — GLUCOSE, CAPILLARY
Glucose-Capillary: 175 mg/dL — ABNORMAL HIGH (ref 70–99)
Glucose-Capillary: 141 mg/dL — ABNORMAL HIGH (ref 70–99)

## 2019-02-03 SURGERY — MICROLARYNGOSCOPY WITH LASER AND BALLOON DILATION
Anesthesia: General | Site: Throat

## 2019-02-03 MED ORDER — CEPHALEXIN 500 MG PO CAPS: 500.0000 mg | ORAL_CAPSULE | Freq: Three times a day (TID) | ORAL | 0 refills | Status: DC

## 2019-02-03 MED ORDER — ROCURONIUM BROMIDE 10 MG/ML (PF) SYRINGE
PREFILLED_SYRINGE | INTRAVENOUS | Status: AC
  Filled 2019-02-03: qty 10

## 2019-02-03 MED ORDER — ONDANSETRON HCL 4 MG/2ML IJ SOLN: 4.0000 mg | Freq: Once | INTRAMUSCULAR | Status: DC | PRN

## 2019-02-03 MED ORDER — ROCURONIUM BROMIDE 100 MG/10ML IV SOLN
INTRAVENOUS | Status: DC | PRN
  Administered 2019-02-03: 40 mg via INTRAVENOUS

## 2019-02-03 MED ORDER — PROPOFOL 500 MG/50ML IV EMUL
INTRAVENOUS | Status: DC | PRN
  Administered 2019-02-03: 75 ug/kg/min via INTRAVENOUS

## 2019-02-03 MED ORDER — LIDOCAINE 2% (20 MG/ML) 5 ML SYRINGE
INTRAMUSCULAR | Status: AC
  Filled 2019-02-03: qty 5

## 2019-02-03 MED ORDER — PROPOFOL 10 MG/ML IV BOLUS
INTRAVENOUS | Status: DC | PRN
  Administered 2019-02-03: 150 mg via INTRAVENOUS

## 2019-02-03 MED ORDER — FENTANYL CITRATE (PF) 100 MCG/2ML IJ SOLN
50.0000 ug | INTRAMUSCULAR | Status: AC | PRN
  Administered 2019-02-03: 25 ug via INTRAVENOUS
  Administered 2019-02-03: 50 ug via INTRAVENOUS
  Administered 2019-02-03: 25 ug via INTRAVENOUS

## 2019-02-03 MED ORDER — FENTANYL CITRATE (PF) 100 MCG/2ML IJ SOLN
INTRAMUSCULAR | Status: AC
  Filled 2019-02-03: qty 2

## 2019-02-03 MED ORDER — ONDANSETRON HCL 4 MG/2ML IJ SOLN
INTRAMUSCULAR | Status: AC
  Filled 2019-02-03: qty 2

## 2019-02-03 MED ORDER — SUGAMMADEX SODIUM 200 MG/2ML IV SOLN
INTRAVENOUS | Status: DC | PRN
  Administered 2019-02-03: 200 mg via INTRAVENOUS

## 2019-02-03 MED ORDER — PROPOFOL 500 MG/50ML IV EMUL
INTRAVENOUS | Status: AC
  Filled 2019-02-03: qty 50

## 2019-02-03 MED ORDER — ONDANSETRON HCL 4 MG/2ML IJ SOLN
INTRAMUSCULAR | Status: DC | PRN
  Administered 2019-02-03: 4 mg via INTRAVENOUS

## 2019-02-03 MED ORDER — LIDOCAINE HCL (CARDIAC) PF 100 MG/5ML IV SOSY
PREFILLED_SYRINGE | INTRAVENOUS | Status: DC | PRN
  Administered 2019-02-03: 100 mg via INTRAVENOUS

## 2019-02-03 MED ORDER — FENTANYL CITRATE (PF) 100 MCG/2ML IJ SOLN: 25.0000 ug | INTRAMUSCULAR | Status: DC | PRN

## 2019-02-03 MED ORDER — DEXAMETHASONE SODIUM PHOSPHATE 4 MG/ML IJ SOLN
INTRAMUSCULAR | Status: DC | PRN
  Administered 2019-02-03: 4 mg via INTRAVENOUS

## 2019-02-03 MED ORDER — MIDAZOLAM HCL 2 MG/2ML IJ SOLN
INTRAMUSCULAR | Status: AC
  Filled 2019-02-03: qty 2

## 2019-02-03 MED ORDER — LACTATED RINGERS IV SOLN
INTRAVENOUS | Status: DC
  Administered 2019-02-03 (×2): via INTRAVENOUS

## 2019-02-03 MED ORDER — ACETAMINOPHEN 10 MG/ML IV SOLN: 1000.0000 mg | Freq: Once | INTRAVENOUS | Status: DC | PRN

## 2019-02-03 MED ORDER — MIDAZOLAM HCL 2 MG/2ML IJ SOLN
1.0000 mg | INTRAMUSCULAR | Status: DC | PRN
  Administered 2019-02-03: 09:00:00 2 mg via INTRAVENOUS

## 2019-02-03 MED ORDER — EPINEPHRINE PF 1 MG/ML IJ SOLN
INTRAMUSCULAR | Status: AC
  Filled 2019-02-03: qty 1

## 2019-02-03 MED ORDER — LIDOCAINE-EPINEPHRINE 1 %-1:100000 IJ SOLN
INTRAMUSCULAR | Status: AC
  Filled 2019-02-03: qty 1

## 2019-02-03 MED ORDER — PROPOFOL 10 MG/ML IV BOLUS
INTRAVENOUS | Status: AC
  Filled 2019-02-03: qty 20

## 2019-02-03 SURGICAL SUPPLY — 43 items
BALLN PULM 12 13.5 15X75 (BALLOONS) ×2
BALLN PULM 12 13.5 15X75CM (BALLOONS) ×1
BALLN PULM 15 16.5 18 X 75CM (BALLOONS)
BALLN PULM 15 16.5 18X75 (BALLOONS)
BALLN PULMONARY 10 12MM (MISCELLANEOUS) ×1
BALLN PULMONARY 10-12 (MISCELLANEOUS) ×2 IMPLANT
BALLN PULMONARY 8 10 OD75CM (MISCELLANEOUS)
BALLN PULMONARY 8-10 OD75 (MISCELLANEOUS) IMPLANT
BALLOON PULM 12 13.5 15X75 (BALLOONS) IMPLANT
BALLOON PULM 15 16.5 18X75 (BALLOONS) IMPLANT
BNDG EYE OVAL (GAUZE/BANDAGES/DRESSINGS) ×6 IMPLANT
CANISTER SUCT 1200ML W/VALVE (MISCELLANEOUS) ×3 IMPLANT
COVER WAND RF STERILE (DRAPES) IMPLANT
DEPRESSOR TONGUE BLADE STERILE (MISCELLANEOUS) ×3 IMPLANT
DRAPE HALF SHEET 70X43 (DRAPES) ×3 IMPLANT
FILTER 7/8 IN (FILTER) ×2 IMPLANT
GAUZE SPONGE 4X4 12PLY STRL LF (GAUZE/BANDAGES/DRESSINGS) ×6 IMPLANT
GLOVE BIOGEL PI IND STRL 7.0 (GLOVE) ×1 IMPLANT
GLOVE BIOGEL PI INDICATOR 7.0 (GLOVE) ×2
GLOVE ECLIPSE 6.5 STRL STRAW (GLOVE) ×3 IMPLANT
GLOVE ECLIPSE 7.5 STRL STRAW (GLOVE) ×3 IMPLANT
GOWN STRL REUS W/ TWL LRG LVL3 (GOWN DISPOSABLE) IMPLANT
GOWN STRL REUS W/ TWL XL LVL3 (GOWN DISPOSABLE) IMPLANT
GOWN STRL REUS W/TWL LRG LVL3 (GOWN DISPOSABLE) ×3
GOWN STRL REUS W/TWL XL LVL3 (GOWN DISPOSABLE) ×3
GUARD TEETH (MISCELLANEOUS) IMPLANT
MARKER SKIN DUAL TIP RULER LAB (MISCELLANEOUS) IMPLANT
NDL SPNL 22GX7 QUINCKE BK (NEEDLE) IMPLANT
NEEDLE SPNL 22GX7 QUINCKE BK (NEEDLE) IMPLANT
NS IRRIG 1000ML POUR BTL (IV SOLUTION) ×3 IMPLANT
PACK BASIN DAY SURGERY FS (CUSTOM PROCEDURE TRAY) ×3 IMPLANT
PATTIES SURGICAL .5 X3 (DISPOSABLE) ×2 IMPLANT
REDUCTION FITTING 1/4 IN (FILTER) ×2 IMPLANT
SLEEVE SCD COMPRESS KNEE MED (MISCELLANEOUS) IMPLANT
SOLUTION BUTLER CLEAR DIP (MISCELLANEOUS) IMPLANT
SURGILUBE 2OZ TUBE FLIPTOP (MISCELLANEOUS) IMPLANT
SYR 5ML LL (SYRINGE) IMPLANT
SYR CONTROL 10ML LL (SYRINGE) IMPLANT
SYR INFLATE BILIARY GAUGE (MISCELLANEOUS) ×2 IMPLANT
SYR TB 1ML LL NO SAFETY (SYRINGE) IMPLANT
TOWEL GREEN STERILE FF (TOWEL DISPOSABLE) ×3 IMPLANT
TUBE CONNECTING 20'X1/4 (TUBING) ×1
TUBE CONNECTING 20X1/4 (TUBING) ×2 IMPLANT

## 2019-02-03 NOTE — Anesthesia Postprocedure Evaluation (Signed)
Anesthesia Post Note  Patient: Victoria Mccormick  Procedure(s) Performed: MICROLARYNGOSCOPY WITH DILATION AND CO2 LASER (N/A Throat)     Patient location during evaluation: PACU Anesthesia Type: General Level of consciousness: awake and alert Pain management: pain level controlled Vital Signs Assessment: post-procedure vital signs reviewed and stable Respiratory status: spontaneous breathing, nonlabored ventilation, respiratory function stable and patient connected to nasal cannula oxygen Cardiovascular status: blood pressure returned to baseline and stable Postop Assessment: no apparent nausea or vomiting Anesthetic complications: no    Last Vitals:  Vitals:   02/03/19 1030 02/03/19 1108  BP: (!) 152/75 (!) 161/66  Pulse: (!) 59 (!) 57  Resp: 15 16  Temp:  36.7 C  SpO2: 100% 96%    Last Pain:  Vitals:   02/03/19 1108  TempSrc:   PainSc: 0-No pain                 Barnet Glasgow

## 2019-02-03 NOTE — Transfer of Care (Signed)
Immediate Anesthesia Transfer of Care Note  Patient: Victoria Mccormick  Procedure(s) Performed: MICROLARYNGOSCOPY WITH DILATION AND CO2 LASER (N/A Throat)  Patient Location: PACU  Anesthesia Type:General  Level of Consciousness: sedated  Airway & Oxygen Therapy: Patient Spontanous Breathing and Patient connected to face mask oxygen  Post-op Assessment: Report given to RN and Post -op Vital signs reviewed and stable  Post vital signs: Reviewed and stable  Last Vitals:  Vitals Value Taken Time  BP 170/81 02/03/19 1006  Temp    Pulse 63 02/03/19 1009  Resp 11 02/03/19 1009  SpO2 100 % 02/03/19 1009  Vitals shown include unvalidated device data.  Last Pain:  Vitals:   02/03/19 0741  TempSrc: Oral  PainSc: 0-No pain      Patients Stated Pain Goal: 3 ([id number])  Complications: No apparent anesthesia complications

## 2019-02-03 NOTE — Op Note (Signed)
OPERATIVE REPORT  DATE OF SURGERY: 02/03/2019  PATIENT:  Victoria Mccormick,  68 y.o. female  PRE-OPERATIVE DIAGNOSIS:  subglottic stenosis  POST-OPERATIVE DIAGNOSIS:  subglottic stenosis  PROCEDURE:  Procedure(s): MICROLARYNGOSCOPY WITH DILATION AND CO2 LASER  SURGEON:  Beckie Salts, MD  ASSISTANTS: None  ANESTHESIA:   General   EBL: 10 ml  DRAINS: None  LOCAL MEDICATIONS USED:  None  SPECIMEN:  none  COUNTS:  Correct  PROCEDURE DETAILS: The patient was taken to the operating room and placed on the operating table in the supine position. Following induction of general endotracheal anesthesia, using a 4.5 endotracheal tube, the table was turned 90 degrees and the patient was draped for endoscopy including saline soaked eye pads and wet towels around the face.  A maxillary tooth protector was used throughout the case.  A Jako laryngoscope was entered into the oral cavity used to visualize the larynx and suspended to the [employer] stand.  The endotracheal tube was removed and was easily replaced through the laryngoscope as needed.  The CO2 laser was used on a setting of 3 W continuous power.  The subglottic stenotic segment was identified and radial cuts were made at 12:00 2:00 10:00 7:00 and 6:00.  The CRE pulmonary balloon dilator was then used, initially up to 10 mm diameter and then a subsequent balloon was used and dilated up to 12 mm.  I was able to serially increase the size of the endotracheal tube up to a #7.  The laryngoscope was then removed and the care of the patient was handed back to anesthesia for awakening of anesthesia and extubation.  Patient was transferred to PACU in stable condition.    PATIENT DISPOSITION:  To PACU, stable

## 2019-02-03 NOTE — Discharge Instructions (Signed)

## 2019-02-03 NOTE — Interval H&P Note (Signed)
History and Physical Interval Note:  02/03/2019 8:21 AM  Victoria Mccormick  has presented today for surgery, with the diagnosis of micsubglottis stenosis.  The various methods of treatment have been discussed with the patient and family. After consideration of risks, benefits and other options for treatment, the patient has consented to  Procedure(s): MICROLARYNGOSCOPY WITH DILATION CO2 LASER (N/A) as a surgical intervention.  The patient's history has been reviewed, patient examined, no change in status, stable for surgery.  I have reviewed the patient's chart and labs.  Questions were answered to the patient's satisfaction.     [name]

## 2019-02-03 NOTE — Anesthesia Procedure Notes (Signed)
Procedure Name: Intubation Date/Time: 02/03/2019 9:16 AM Performed by: Maryella Shivers, CRNA Pre-anesthesia Checklist: Patient identified, Emergency Drugs available, Suction available and Patient being monitored Patient Re-evaluated:Patient Re-evaluated prior to induction Oxygen Delivery Method: Circle system utilized Preoxygenation: Pre-oxygenation with 100% oxygen Induction Type: IV induction Ventilation: Mask ventilation without difficulty Laryngoscope Size: Glidescope and 4 Grade View: Grade I Tube type: Oral Tube size: 4.5 mm Number of attempts: 1 Airway Equipment and Method: Stylet and Oral airway Placement Confirmation: ETT inserted through vocal cords under direct vision,  positive ETCO2 and breath sounds checked- equal and bilateral Tube secured with: Tape Dental Injury: Teeth and Oropharynx as per pre-operative assessment

## 2019-02-04 ENCOUNTER — Encounter (HOSPITAL_BASED_OUTPATIENT_CLINIC_OR_DEPARTMENT_OTHER): Payer: Self-pay | Admitting: Otolaryngology

## 2019-02-07 ENCOUNTER — Other Ambulatory Visit: Payer: Self-pay | Admitting: Family Medicine

## 2019-02-07 DIAGNOSIS — Z1231 Encounter for screening mammogram for malignant neoplasm of breast: Secondary | ICD-10-CM

## 2019-04-04 ENCOUNTER — Ambulatory Visit: Payer: Medicare Other

## 2019-05-12 ENCOUNTER — Other Ambulatory Visit: Payer: Self-pay

## 2019-05-12 ENCOUNTER — Ambulatory Visit
Admission: RE | Admit: 2019-05-12 | Discharge: 2019-05-12 | Disposition: A | Discharge status: Home / Self Care | Payer: Medicare Other | Source: Ambulatory Visit | Attending: Family Medicine | Admitting: Family Medicine

## 2019-05-12 DIAGNOSIS — Z1231 Encounter for screening mammogram for malignant neoplasm of breast: Secondary | ICD-10-CM

## 2019-07-30 NOTE — H&P (Signed)
Complaint: Stridor HPI: Long history of subglottic stenosis, having worsening stridor and exercise intolerance.          Past Medical History:  Diagnosis Date   Arthritis     Complication of anesthesia 12/2015    states "jaw hyperextended and now has TMJ issues"   Complication of anesthesia      generalized muscle weakness after anesthesia for many days   Diabetes mellitus     GERD (gastroesophageal reflux disease)     Hyperlipemia     Hypertension     Subglottic stenosis     Wears glasses      reading               Past Surgical History:  Procedure Laterality Date   CESAREAN SECTION        x2   DIRECT LARYNGOSCOPY        glottic stenosis dilated-multiple times-2003to -2013 #8   KNEE ARTHROSCOPY Left 12/2015   MICROLARYNGOSCOPY WITH DILATION N/A 09/01/2016    Procedure: MICROLARYNGOSCOPY WITH BALLOON DILATION;  Surgeon: Hart Haas, MD;  Location: [facility];  Service: ENT;  Laterality: N/A;   MICROLARYNGOSCOPY WITH LASER AND BALLOON DILATION N/A 08/15/2012    Procedure: MICROLARYNGOSCOPY WITH LASER AND BALLOON DILATION;  Surgeon: Huntley Demedeiros, MD;  Location: [facility];  Service: ENT;  Laterality: N/A;  Tracheal Dilation   MICROLARYNGOSCOPY WITH LASER AND BALLOON DILATION N/A 01/31/2013    Procedure: MICROLARYNGOSCOPY WITH LASER AND BALLOON DILATION;  Surgeon: Ahmere Hemenway, MD;  Location: [facility];  Service: ENT;  Laterality: N/A;   MICROLARYNGOSCOPY WITH LASER AND BALLOON DILATION N/A 08/18/2013    Procedure: MICROLARYNGOSCOPY WITH LASER AND BALLOON DILATION;  Surgeon: Darienne Belleau, MD;  Location: [facility];  Service: ENT;  Laterality: N/A;   MICROLARYNGOSCOPY WITH LASER AND BALLOON DILATION N/A 08/27/2014    Procedure: MICROLARYNGOSCOPY WITH LASER AND BALLOON DILATION;  Surgeon: Vernor Monnig, MD;  Location: [facility];  Service: ENT;  Laterality: N/A;   MICROLARYNGOSCOPY WITH LASER AND BALLOON DILATION N/A  07/15/2015    Procedure: MICROLARYNGOSCOPY WITH LASER AND BALLOON DILATION;  Surgeon: Janaiya Beauchesne, MD;  Location: [facility];  Service: ENT;  Laterality: N/A;   MICROLARYNGOSCOPY WITH LASER AND BALLOON DILATION N/A 01/28/2016    Procedure: MICROLARYNGOSCOPY WITH LASER AND BALLOON DILATION;  Surgeon: Jahmir Salo, MD;  Location: [facility];  Service: ENT;  Laterality: N/A;   MICROLARYNGOSCOPY WITH LASER AND BALLOON DILATION N/A 05/18/2017    Procedure: MICROLARYNGOSCOPY WITH LASER AND BALLOON DILATION;  Surgeon: Zackerie Sara, MD;  Location: [facility];  Service: ENT;  Laterality: N/A;   MICROLARYNGOSCOPY WITH LASER AND BALLOON DILATION N/A 01/28/2018    Procedure: MICROLARYNGOSCOPY with laser balloon dilation;  Surgeon: Hiram Mciver, MD;  [facility];  Service: ENT;  Laterality: N/A;      No family history on file. Social History:  reports that she has quit smoking. She has never used smokeless tobacco. She reports current alcohol use. She reports that she does not use drugs.   Allergies: No Known Allergies   No medications prior to admission.      Lab Results Last 48 Hours  No results found for this or any previous visit (from the past 48 hour(s)).    Imaging Results (Last 48 hours)  No results found.      ROS: otherwise negative   There were no vitals taken for   this visit.   PHYSICAL EXAM: Overall appearance:  Healthy appearing, inspiratory stridor present. Head:  Normocephalic, atraumatic. Ears: External auditory canals are clear; tympanic membranes are intact and the middle ears are free of any effusion. Nose: External nose is healthy in appearance. Internal nasal exam free of any lesions or obstruction. Oral Cavity/pharynx:  There are no mucosal lesions or masses identified. Hypopharynx/Larynx: no signs of any mucosal lesions or masses identified. Vocal cords move normally. Neuro:  No identifiable neurologic  deficits. Neck: No palpable neck masses.   Studies Reviewed: none       Assessment/Plan Revision microlaryngoscopy with laser excision and balloon dilation of subglottic stenosis  

## 2019-08-04 ENCOUNTER — Encounter (HOSPITAL_BASED_OUTPATIENT_CLINIC_OR_DEPARTMENT_OTHER): Payer: Self-pay | Admitting: Otolaryngology

## 2019-08-04 ENCOUNTER — Other Ambulatory Visit: Payer: Self-pay

## 2019-08-05 ENCOUNTER — Encounter (HOSPITAL_BASED_OUTPATIENT_CLINIC_OR_DEPARTMENT_OTHER)
Admission: RE | Admit: 2019-08-05 | Discharge: 2019-08-05 | Disposition: A | Discharge status: Home / Self Care | Payer: Medicare Other | Source: Ambulatory Visit | Attending: Otolaryngology | Admitting: Otolaryngology

## 2019-08-05 DIAGNOSIS — Z01810 Encounter for preprocedural cardiovascular examination: Secondary | ICD-10-CM | POA: Diagnosis present

## 2019-08-07 ENCOUNTER — Other Ambulatory Visit (HOSPITAL_COMMUNITY)
Admission: RE | Admit: 2019-08-07 | Discharge: 2019-08-07 | Disposition: A | Discharge status: Home / Self Care | Payer: Medicare Other | Source: Ambulatory Visit | Attending: Otolaryngology | Admitting: Otolaryngology

## 2019-08-07 DIAGNOSIS — Z20822 Contact with and (suspected) exposure to covid-19: Secondary | ICD-10-CM | POA: Insufficient documentation

## 2019-08-07 DIAGNOSIS — Z01812 Encounter for preprocedural laboratory examination: Secondary | ICD-10-CM | POA: Insufficient documentation

## 2019-08-08 LAB — SARS CORONAVIRUS 2 (TAT 6-24 HRS): SARS Coronavirus 2: NEGATIVE

## 2019-08-10 NOTE — Anesthesia Preprocedure Evaluation (Addendum)
Anesthesia Evaluation  Patient identified by MRN, date of birth, ID band Patient awake    Reviewed: Allergy & Precautions, NPO status , Patient's Chart, lab work & pertinent test results  Airway Mallampati: IV  TM Distance: >3 FB Neck ROM: Full    Dental no notable dental hx.    Pulmonary former smoker,    Pulmonary exam normal breath sounds clear to auscultation       Cardiovascular hypertension, Pt. on medications and Pt. on home beta blockers Normal cardiovascular exam Rhythm:Regular Rate:Normal     Neuro/Psych negative neurological ROS  negative psych ROS   GI/Hepatic negative GI ROS, Neg liver ROS,   Endo/Other  diabetes, Poorly Controlled, Oral Hypoglycemic Agents  Renal/GU negative Renal ROS     Musculoskeletal negative musculoskeletal ROS (+)   Abdominal   Peds  Hematology negative hematology ROS (+)   Anesthesia Other Findings SUBGLOTIC STENOSIS  Reproductive/Obstetrics                            Anesthesia Physical Anesthesia Plan  ASA: III  Anesthesia Plan: General   Post-op Pain Management:    Induction: Intravenous  PONV Risk Score and Plan: 4 or greater and Ondansetron, Dexamethasone, Midazolam, Treatment may vary due to age or medical condition and Propofol infusion  Airway Management Planned: Oral ETT and Video Laryngoscope Planned  Additional Equipment:   Intra-op Plan:   Post-operative Plan: Extubation in OR  Informed Consent: I have reviewed the patients History and Physical, chart, labs and discussed the procedure including the risks, benefits and alternatives for the proposed anesthesia with the patient or authorized representative who has indicated his/her understanding and acceptance.     Dental advisory given  Plan Discussed with: CRNA  Anesthesia Plan Comments:        Anesthesia Quick Evaluation

## 2019-08-11 ENCOUNTER — Ambulatory Visit (HOSPITAL_BASED_OUTPATIENT_CLINIC_OR_DEPARTMENT_OTHER): Payer: Medicare Other | Admitting: Anesthesiology

## 2019-08-11 ENCOUNTER — Encounter (HOSPITAL_BASED_OUTPATIENT_CLINIC_OR_DEPARTMENT_OTHER): Payer: Self-pay | Admitting: Otolaryngology

## 2019-08-11 ENCOUNTER — Other Ambulatory Visit: Payer: Self-pay

## 2019-08-11 ENCOUNTER — Encounter (HOSPITAL_BASED_OUTPATIENT_CLINIC_OR_DEPARTMENT_OTHER)
Admission: RE | Disposition: A | Discharge status: Home / Self Care | Payer: Self-pay | Source: Home / Self Care | Attending: Otolaryngology

## 2019-08-11 ENCOUNTER — Ambulatory Visit (HOSPITAL_BASED_OUTPATIENT_CLINIC_OR_DEPARTMENT_OTHER)
Admission: RE | Admit: 2019-08-11 | Discharge: 2019-08-11 | Disposition: A | Discharge status: Home / Self Care | Payer: Medicare Other | Attending: Otolaryngology | Admitting: Otolaryngology

## 2019-08-11 DIAGNOSIS — E785 Hyperlipidemia, unspecified: Secondary | ICD-10-CM | POA: Insufficient documentation

## 2019-08-11 DIAGNOSIS — I10 Essential (primary) hypertension: Secondary | ICD-10-CM | POA: Diagnosis not present

## 2019-08-11 DIAGNOSIS — J386 Stenosis of larynx: Secondary | ICD-10-CM | POA: Insufficient documentation

## 2019-08-11 DIAGNOSIS — Z87891 Personal history of nicotine dependence: Secondary | ICD-10-CM | POA: Diagnosis not present

## 2019-08-11 DIAGNOSIS — E119 Type 2 diabetes mellitus without complications: Secondary | ICD-10-CM | POA: Insufficient documentation

## 2019-08-11 DIAGNOSIS — K219 Gastro-esophageal reflux disease without esophagitis: Secondary | ICD-10-CM | POA: Insufficient documentation

## 2019-08-11 DIAGNOSIS — M1990 Unspecified osteoarthritis, unspecified site: Secondary | ICD-10-CM | POA: Insufficient documentation

## 2019-08-11 HISTORY — PX: MICROLARYNGOSCOPY WITH LASER AND BALLOON DILATION: SHX5973

## 2019-08-11 LAB — GLUCOSE, CAPILLARY
Glucose-Capillary: 160 mg/dL — ABNORMAL HIGH (ref 70–99)
Glucose-Capillary: 160 mg/dL — ABNORMAL HIGH (ref 70–99)

## 2019-08-11 SURGERY — MICROLARYNGOSCOPY WITH LASER AND BALLOON DILATION
Anesthesia: General | Site: Throat

## 2019-08-11 MED ORDER — ONDANSETRON HCL 4 MG/2ML IJ SOLN
INTRAMUSCULAR | Status: AC
  Filled 2019-08-11: qty 2

## 2019-08-11 MED ORDER — FENTANYL CITRATE (PF) 100 MCG/2ML IJ SOLN
INTRAMUSCULAR | Status: AC
  Filled 2019-08-11: qty 2

## 2019-08-11 MED ORDER — LIDOCAINE 2% (20 MG/ML) 5 ML SYRINGE
INTRAMUSCULAR | Status: AC
  Filled 2019-08-11: qty 5

## 2019-08-11 MED ORDER — LACTATED RINGERS IV SOLN: INTRAVENOUS | Status: DC | PRN

## 2019-08-11 MED ORDER — EPINEPHRINE PF 1 MG/ML IJ SOLN
INTRAMUSCULAR | Status: AC
  Filled 2019-08-11: qty 1

## 2019-08-11 MED ORDER — FENTANYL CITRATE (PF) 100 MCG/2ML IJ SOLN: 25.0000 ug | INTRAMUSCULAR | Status: DC | PRN

## 2019-08-11 MED ORDER — MIDAZOLAM HCL 2 MG/2ML IJ SOLN
INTRAMUSCULAR | Status: DC | PRN
  Administered 2019-08-11: 2 mg via INTRAVENOUS

## 2019-08-11 MED ORDER — PROPOFOL 10 MG/ML IV BOLUS
INTRAVENOUS | Status: DC | PRN
  Administered 2019-08-11: 50 mg via INTRAVENOUS
  Administered 2019-08-11: 80 mg via INTRAVENOUS
  Administered 2019-08-11: 70 mg via INTRAVENOUS

## 2019-08-11 MED ORDER — ROCURONIUM BROMIDE 10 MG/ML (PF) SYRINGE
PREFILLED_SYRINGE | INTRAVENOUS | Status: AC
  Filled 2019-08-11: qty 10

## 2019-08-11 MED ORDER — ONDANSETRON HCL 4 MG/2ML IJ SOLN: 4.0000 mg | Freq: Once | INTRAMUSCULAR | Status: DC | PRN

## 2019-08-11 MED ORDER — ONDANSETRON HCL 4 MG/2ML IJ SOLN
INTRAMUSCULAR | Status: DC | PRN
  Administered 2019-08-11: 4 mg via INTRAVENOUS

## 2019-08-11 MED ORDER — DEXAMETHASONE SODIUM PHOSPHATE 10 MG/ML IJ SOLN
INTRAMUSCULAR | Status: AC
  Filled 2019-08-11: qty 1

## 2019-08-11 MED ORDER — MIDAZOLAM HCL 2 MG/2ML IJ SOLN
INTRAMUSCULAR | Status: AC
  Filled 2019-08-11: qty 2

## 2019-08-11 MED ORDER — SUGAMMADEX SODIUM 200 MG/2ML IV SOLN
INTRAVENOUS | Status: DC | PRN
  Administered 2019-08-11: 200 mg via INTRAVENOUS

## 2019-08-11 MED ORDER — LIDOCAINE-EPINEPHRINE 1 %-1:100000 IJ SOLN
INTRAMUSCULAR | Status: AC
  Filled 2019-08-11: qty 1

## 2019-08-11 MED ORDER — PROPOFOL 500 MG/50ML IV EMUL
INTRAVENOUS | Status: DC | PRN
  Administered 2019-08-11: 100 ug/kg/min via INTRAVENOUS

## 2019-08-11 MED ORDER — LACTATED RINGERS IV SOLN: INTRAVENOUS | Status: DC

## 2019-08-11 MED ORDER — FENTANYL CITRATE (PF) 100 MCG/2ML IJ SOLN
INTRAMUSCULAR | Status: DC | PRN
  Administered 2019-08-11 (×2): 50 ug via INTRAVENOUS

## 2019-08-11 MED ORDER — LIDOCAINE 2% (20 MG/ML) 5 ML SYRINGE
INTRAMUSCULAR | Status: DC | PRN
  Administered 2019-08-11: 80 mg via INTRAVENOUS

## 2019-08-11 MED ORDER — DEXAMETHASONE SODIUM PHOSPHATE 10 MG/ML IJ SOLN
INTRAMUSCULAR | Status: DC | PRN
  Administered 2019-08-11: 10 mg via INTRAVENOUS

## 2019-08-11 MED ORDER — ACETAMINOPHEN 500 MG PO TABS: 1000.0000 mg | ORAL_TABLET | Freq: Once | ORAL | Status: DC

## 2019-08-11 MED ORDER — ROCURONIUM BROMIDE 10 MG/ML (PF) SYRINGE
PREFILLED_SYRINGE | INTRAVENOUS | Status: DC | PRN
  Administered 2019-08-11: 30 mg via INTRAVENOUS
  Administered 2019-08-11: 20 mg via INTRAVENOUS

## 2019-08-11 MED ORDER — CLINDAMYCIN HCL 300 MG PO CAPS
300.0000 mg | ORAL_CAPSULE | Freq: Three times a day (TID) | ORAL | 0 refills | Status: DC
Start: 2019-08-11 — End: 2020-02-03

## 2019-08-11 MED ORDER — PROPOFOL 500 MG/50ML IV EMUL
INTRAVENOUS | Status: AC
  Filled 2019-08-11: qty 50

## 2019-08-11 MED ORDER — ACETAMINOPHEN 10 MG/ML IV SOLN: 1000.0000 mg | Freq: Once | INTRAVENOUS | Status: DC | PRN

## 2019-08-11 SURGICAL SUPPLY — 43 items
BALLN PULM 12 13.5 15X75 (BALLOONS) ×2
BALLN PULM 12 13.5 15X75CM (BALLOONS) ×1
BALLN PULM 15 16.5 18 X 75CM (BALLOONS)
BALLN PULM 15 16.5 18X75 (BALLOONS)
BALLN PULMONARY 10 12MM (MISCELLANEOUS)
BALLN PULMONARY 10-12 (MISCELLANEOUS) IMPLANT
BALLN PULMONARY 8 10 OD75CM (MISCELLANEOUS)
BALLN PULMONARY 8-10 OD75 (MISCELLANEOUS) IMPLANT
BALLOON PULM 12 13.5 15X75 (BALLOONS) IMPLANT
BALLOON PULM 15 16.5 18X75 (BALLOONS) IMPLANT
BNDG EYE OVAL (GAUZE/BANDAGES/DRESSINGS) ×6 IMPLANT
CANISTER SUCT 1200ML W/VALVE (MISCELLANEOUS) ×3 IMPLANT
COVER WAND RF STERILE (DRAPES) IMPLANT
DEPRESSOR TONGUE BLADE STERILE (MISCELLANEOUS) ×3 IMPLANT
FILTER 7/8 IN (FILTER) IMPLANT
GAUZE SPONGE 4X4 12PLY STRL LF (GAUZE/BANDAGES/DRESSINGS) ×6 IMPLANT
GLOVE BIO SURGEON STRL SZ 6.5 (GLOVE) ×1 IMPLANT
GLOVE BIO SURGEONS STRL SZ 6.5 (GLOVE) ×1
GLOVE ECLIPSE 7.5 STRL STRAW (GLOVE) ×3 IMPLANT
GOWN STRL REUS W/ TWL LRG LVL3 (GOWN DISPOSABLE) IMPLANT
GOWN STRL REUS W/ TWL XL LVL3 (GOWN DISPOSABLE) IMPLANT
GOWN STRL REUS W/TWL LRG LVL3 (GOWN DISPOSABLE)
GOWN STRL REUS W/TWL XL LVL3 (GOWN DISPOSABLE)
GUARD TEETH (MISCELLANEOUS) ×4 IMPLANT
MARKER SKIN DUAL TIP RULER LAB (MISCELLANEOUS) IMPLANT
NDL SPNL 22GX7 QUINCKE BK (NEEDLE) IMPLANT
NEEDLE SPNL 22GX7 QUINCKE BK (NEEDLE) IMPLANT
NS IRRIG 1000ML POUR BTL (IV SOLUTION) ×3 IMPLANT
PATTIES SURGICAL .5 X3 (DISPOSABLE) ×2 IMPLANT
REDUCTION FITTING 1/4 IN (FILTER) IMPLANT
SET BASIN DAY SURGERY F.S. (CUSTOM PROCEDURE TRAY) ×3 IMPLANT
SHEET MEDIUM DRAPE 40X70 STRL (DRAPES) ×3 IMPLANT
SLEEVE SCD COMPRESS KNEE MED (MISCELLANEOUS) ×2 IMPLANT
SOLUTION BUTLER CLEAR DIP (MISCELLANEOUS) IMPLANT
SURGILUBE 2OZ TUBE FLIPTOP (MISCELLANEOUS) IMPLANT
SYR 5ML LL (SYRINGE) IMPLANT
SYR CONTROL 10ML LL (SYRINGE) ×2 IMPLANT
SYR GAUGE ASSEMBLY (MISCELLANEOUS) ×1
SYR GAUGE ASSEMBLY ALLIANCE II (MISCELLANEOUS) ×1 IMPLANT
SYR TB 1ML LL NO SAFETY (SYRINGE) IMPLANT
TOWEL GREEN STERILE FF (TOWEL DISPOSABLE) ×3 IMPLANT
TUBE CONNECTING 20'X1/4 (TUBING) ×1
TUBE CONNECTING 20X1/4 (TUBING) ×2 IMPLANT

## 2019-08-11 NOTE — Interval H&P Note (Signed)
History and Physical Interval Note:  08/11/2019 7:16 AM  Victoria Mccormick  has presented today for surgery, with the diagnosis of SUBGLOTIC STENOSIS.  The various methods of treatment have been discussed with the patient and family. After consideration of risks, benefits and other options for treatment, the patient has consented to  Procedure(s): MICROLARYNGOSCOPY WITH DILATION CO2 LASER (N/A) as a surgical intervention.  The patient's history has been reviewed, patient examined, no change in status, stable for surgery.  I have reviewed the patient's chart and labs.  Questions were answered to the patient's satisfaction.     [name]

## 2019-08-11 NOTE — Anesthesia Procedure Notes (Signed)
Procedure Name: Intubation Date/Time: 08/11/2019 7:50 AM Performed by: Genelle Bal, CRNA Pre-anesthesia Checklist: Patient identified, Emergency Drugs available, Suction available and Patient being monitored Patient Re-evaluated:Patient Re-evaluated prior to induction Oxygen Delivery Method: Circle system utilized Preoxygenation: Pre-oxygenation with 100% oxygen Induction Type: IV induction Ventilation: Mask ventilation without difficulty Laryngoscope Size: Glidescope and 3 Grade View: Grade I Tube type: Oral Tube size: 4.5 mm Number of attempts: 1 Airway Equipment and Method: Stylet and Oral airway Placement Confirmation: ETT inserted through vocal cords under direct vision,  positive ETCO2 and breath sounds checked- equal and bilateral Tube secured with: Tape Dental Injury: Teeth and Oropharynx as per pre-operative assessment  Difficulty Due To: Difficulty was anticipated and Difficult Airway- due to anterior larynx Future Recommendations: Recommend- induction with short-acting agent, and alternative techniques readily available

## 2019-08-11 NOTE — Transfer of Care (Signed)
Immediate Anesthesia Transfer of Care Note  Patient: Victoria Mccormick  Procedure(s) Performed: MICROLARYNGOSCOPY WITH DILATION CO2 LASER (N/A Throat)  Patient Location: PACU  Anesthesia Type:General  Level of Consciousness: awake, alert  and oriented  Airway & Oxygen Therapy: Patient Spontanous Breathing and Patient connected to face mask oxygen  Post-op Assessment: Report given to RN and Post -op Vital signs reviewed and stable  Post vital signs: Reviewed and stable  Last Vitals:  Vitals Value Taken Time  BP 152/75 08/11/19 0850  Temp 36.2 C 08/11/19 0850  Pulse 60 08/11/19 0853  Resp 12 08/11/19 0853  SpO2 100 % 08/11/19 0853  Vitals shown include unvalidated device data.  Last Pain:  Vitals:   08/11/19 0850  TempSrc:   PainSc: Asleep         Complications: No apparent anesthesia complications

## 2019-08-11 NOTE — Op Note (Signed)
OPERATIVE REPORT  DATE OF SURGERY: 08/11/2019  PATIENT:  Victoria Mccormick,  69 y.o. female  PRE-OPERATIVE DIAGNOSIS:  SUBGLOTIC STENOSIS  POST-OPERATIVE DIAGNOSIS:  SUBGLOTIC STENOSIS  PROCEDURE:  Procedure(s): MICROLARYNGOSCOPY WITH DILATION CO2 LASER  SURGEON:  Beckie Salts, MD  ASSISTANTS: None  ANESTHESIA:   General   EBL: 5 ml  DRAINS: None  LOCAL MEDICATIONS USED:  None  SPECIMEN:  none  COUNTS:  Correct  PROCEDURE DETAILS: The patient was taken to the operating room and placed on the operating table in the supine position. Following induction of general endotracheal anesthesia, using a 4.5 endotracheal tube, the table was turned 90 degrees and the patient was draped in a standard fashion.  Saline soaked eye pads were used and wet towels were used around the face for laser protection.  A maxillary tooth protector was used throughout the case.  The Jako laryngoscope was entered into the oral cavity used to visualize the larynx.  He was attached to the [place] stand with the suspension apparatus.  The endotracheal tube was removed and then replaced through the laryngoscope.  This was done repeatedly throughout the case.  Using apneic technique the subglottic area was inspected using the operating microscope and a CO2 laser attachment at 2 W continuous power and radial cuts were created at 10:00 12:00 2:00 and 8:00.  The CRE balloon dilator was then used up to 8 atm which correlated to 15 mm diameter.  This was done on 3 separate occasions held for 30 seconds each time.  At the end of the procedure a #7 endotracheal tube was placed without difficulty.  The scope was removed.  The patient was then handed over to anesthesia for extubation and awakening from anesthesia.    PATIENT DISPOSITION:  To PACU, stable

## 2019-08-11 NOTE — Discharge Instructions (Signed)

## 2019-08-11 NOTE — Anesthesia Postprocedure Evaluation (Signed)
Anesthesia Post Note  Patient: Victoria Mccormick  Procedure(s) Performed: MICROLARYNGOSCOPY WITH DILATION CO2 LASER (N/A Throat)     Patient location during evaluation: PACU Anesthesia Type: General Level of consciousness: awake and alert Pain management: pain level controlled Vital Signs Assessment: post-procedure vital signs reviewed and stable Respiratory status: spontaneous breathing, nonlabored ventilation, respiratory function stable and patient connected to nasal cannula oxygen Cardiovascular status: blood pressure returned to baseline and stable Postop Assessment: no apparent nausea or vomiting Anesthetic complications: no    Last Vitals:  Vitals:   08/11/19 0914 08/11/19 0945  BP: (!) 141/70 (!) 155/72  Pulse: 60 63  Resp: 12 16  Temp:  36.7 C  SpO2: 97% 98%    Last Pain:  Vitals:   08/11/19 0931  TempSrc:   PainSc: 0-No pain                 Esdras Delair P Recia Sons

## 2019-08-13 ENCOUNTER — Encounter: Payer: Self-pay | Admitting: *Deleted

## 2020-02-03 ENCOUNTER — Other Ambulatory Visit: Payer: Self-pay

## 2020-02-03 ENCOUNTER — Encounter (HOSPITAL_BASED_OUTPATIENT_CLINIC_OR_DEPARTMENT_OTHER): Payer: Self-pay | Admitting: Otolaryngology

## 2020-02-10 ENCOUNTER — Encounter (HOSPITAL_BASED_OUTPATIENT_CLINIC_OR_DEPARTMENT_OTHER)
Admission: RE | Admit: 2020-02-10 | Discharge: 2020-02-10 | Disposition: A | Discharge status: Home / Self Care | Payer: Medicare Other | Source: Ambulatory Visit | Attending: Otolaryngology | Admitting: Otolaryngology

## 2020-02-10 DIAGNOSIS — Z01812 Encounter for preprocedural laboratory examination: Secondary | ICD-10-CM | POA: Diagnosis present

## 2020-02-10 LAB — BASIC METABOLIC PANEL
Anion gap: 10 (ref 5–15)
BUN: 17 mg/dL (ref 8–23)
CO2: 29 mmol/L (ref 22–32)
Calcium: 9.4 mg/dL (ref 8.9–10.3)
Chloride: 99 mmol/L (ref 98–111)
Creatinine, Ser: 0.98 mg/dL (ref 0.44–1.00)
GFR, Estimated: >60 mL/min (ref >60)
Glucose, Bld: 151 mg/dL — ABNORMAL HIGH (ref 70–99)
Potassium: 4.3 mmol/L (ref 3.5–5.1)
Sodium: 138 mmol/L (ref 135–145)

## 2020-02-10 NOTE — H&P (Signed)
Complaint: Stridor HPI: Long history of subglottic stenosis, having worsening stridor and exercise intolerance.          Past Medical History:  Diagnosis Date   Arthritis     Complication of anesthesia 12/2015    states "jaw hyperextended and now has TMJ issues"   Complication of anesthesia      generalized muscle weakness after anesthesia for many days   Diabetes mellitus     GERD (gastroesophageal reflux disease)     Hyperlipemia     Hypertension     Subglottic stenosis     Wears glasses      reading               Past Surgical History:  Procedure Laterality Date   CESAREAN SECTION        x2   DIRECT LARYNGOSCOPY        glottic stenosis dilated-multiple times-2003to -2013 #8   KNEE ARTHROSCOPY Left 12/2015   MICROLARYNGOSCOPY WITH DILATION N/A 09/01/2016    Procedure: MICROLARYNGOSCOPY WITH BALLOON DILATION;  Surgeon: Sruti Ayllon, MD;  Location: [facility];  Service: ENT;  Laterality: N/A;   MICROLARYNGOSCOPY WITH LASER AND BALLOON DILATION N/A 08/15/2012    Procedure: MICROLARYNGOSCOPY WITH LASER AND BALLOON DILATION;  Surgeon: Britiney Blahnik, MD;  Location: [facility];  Service: ENT;  Laterality: N/A;  Tracheal Dilation   MICROLARYNGOSCOPY WITH LASER AND BALLOON DILATION N/A 01/31/2013    Procedure: MICROLARYNGOSCOPY WITH LASER AND BALLOON DILATION;  Surgeon: Jadavion Spoelstra, MD;  Location: [facility];  Service: ENT;  Laterality: N/A;   MICROLARYNGOSCOPY WITH LASER AND BALLOON DILATION N/A 08/18/2013    Procedure: MICROLARYNGOSCOPY WITH LASER AND BALLOON DILATION;  Surgeon: Nicholson Starace, MD;  Location: [facility];  Service: ENT;  Laterality: N/A;   MICROLARYNGOSCOPY WITH LASER AND BALLOON DILATION N/A 08/27/2014    Procedure: MICROLARYNGOSCOPY WITH LASER AND BALLOON DILATION;  Surgeon: Arilynn Blakeney, MD;  Location: [facility];  Service: ENT;  Laterality: N/A;   MICROLARYNGOSCOPY WITH LASER AND BALLOON DILATION N/A  07/15/2015    Procedure: MICROLARYNGOSCOPY WITH LASER AND BALLOON DILATION;  Surgeon: Arland Usery, MD;  Location: [facility];  Service: ENT;  Laterality: N/A;   MICROLARYNGOSCOPY WITH LASER AND BALLOON DILATION N/A 01/28/2016    Procedure: MICROLARYNGOSCOPY WITH LASER AND BALLOON DILATION;  Surgeon: Japji Kok, MD;  Location: [facility];  Service: ENT;  Laterality: N/A;   MICROLARYNGOSCOPY WITH LASER AND BALLOON DILATION N/A 05/18/2017    Procedure: MICROLARYNGOSCOPY WITH LASER AND BALLOON DILATION;  Surgeon: Trentyn Boisclair, MD;  Location: [facility];  Service: ENT;  Laterality: N/A;   MICROLARYNGOSCOPY WITH LASER AND BALLOON DILATION N/A 01/28/2018    Procedure: MICROLARYNGOSCOPY with laser balloon dilation;  Surgeon: Leane Loring, MD;  Location: [facility];  Service: ENT;  Laterality: N/A;      No family history on file. Social History:  reports that she has quit smoking. She has never used smokeless tobacco. She reports current alcohol use. She reports that she does not use drugs.   Allergies: No Known Allergies   No medications prior to admission.      Lab Results Last 48 Hours  No results found for this or any previous visit (from the past 48 hour(s)).    Imaging Results (Last 48 hours)  No results found.      ROS: otherwise negative   There were no vitals taken for   this visit.   PHYSICAL EXAM: Overall appearance:  Healthy appearing, inspiratory stridor present. Head:  Normocephalic, atraumatic. Ears: External auditory canals are clear; tympanic membranes are intact and the middle ears are free of any effusion. Nose: External nose is healthy in appearance. Internal nasal exam free of any lesions or obstruction. Oral Cavity/pharynx:  There are no mucosal lesions or masses identified. Hypopharynx/Larynx: no signs of any mucosal lesions or masses identified. Vocal cords move normally. Neuro:  No identifiable neurologic  deficits. Neck: No palpable neck masses.   Studies Reviewed: none       Assessment/Plan Revision microlaryngoscopy with laser excision and balloon dilation of subglottic stenosis  

## 2020-02-11 NOTE — Progress Notes (Signed)
Reviewed chart with Dr Gifford Shave (succinylcholine intolerance) who said OK to proceed with surgery.

## 2020-02-14 ENCOUNTER — Other Ambulatory Visit (HOSPITAL_COMMUNITY)
Admission: RE | Admit: 2020-02-14 | Discharge: 2020-02-14 | Disposition: A | Discharge status: Home / Self Care | Payer: Medicare Other | Source: Ambulatory Visit | Attending: Otolaryngology | Admitting: Otolaryngology

## 2020-02-14 DIAGNOSIS — Z01812 Encounter for preprocedural laboratory examination: Secondary | ICD-10-CM | POA: Insufficient documentation

## 2020-02-14 DIAGNOSIS — Z20822 Contact with and (suspected) exposure to covid-19: Secondary | ICD-10-CM | POA: Insufficient documentation

## 2020-02-15 LAB — SARS CORONAVIRUS 2 (TAT 6-24 HRS): SARS Coronavirus 2: NEGATIVE

## 2020-02-16 ENCOUNTER — Encounter (HOSPITAL_BASED_OUTPATIENT_CLINIC_OR_DEPARTMENT_OTHER): Payer: Self-pay | Admitting: Otolaryngology

## 2020-02-16 ENCOUNTER — Ambulatory Visit (HOSPITAL_BASED_OUTPATIENT_CLINIC_OR_DEPARTMENT_OTHER): Payer: Medicare Other | Admitting: Anesthesiology

## 2020-02-16 ENCOUNTER — Ambulatory Visit (HOSPITAL_BASED_OUTPATIENT_CLINIC_OR_DEPARTMENT_OTHER)
Admission: RE | Admit: 2020-02-16 | Discharge: 2020-02-16 | Disposition: A | Discharge status: Home / Self Care | Payer: Medicare Other | Source: Ambulatory Visit | Attending: Otolaryngology | Admitting: Otolaryngology

## 2020-02-16 ENCOUNTER — Encounter (HOSPITAL_BASED_OUTPATIENT_CLINIC_OR_DEPARTMENT_OTHER)
Admission: RE | Disposition: A | Discharge status: Home / Self Care | Payer: Self-pay | Source: Ambulatory Visit | Attending: Otolaryngology

## 2020-02-16 ENCOUNTER — Other Ambulatory Visit: Payer: Self-pay

## 2020-02-16 DIAGNOSIS — Z87891 Personal history of nicotine dependence: Secondary | ICD-10-CM | POA: Diagnosis not present

## 2020-02-16 DIAGNOSIS — J386 Stenosis of larynx: Secondary | ICD-10-CM | POA: Insufficient documentation

## 2020-02-16 HISTORY — PX: MICROLARYNGOSCOPY WITH LASER AND BALLOON DILATION: SHX5973

## 2020-02-16 LAB — GLUCOSE, CAPILLARY
Glucose-Capillary: 145 mg/dL — ABNORMAL HIGH (ref 70–99)
Glucose-Capillary: 139 mg/dL — ABNORMAL HIGH (ref 70–99)

## 2020-02-16 SURGERY — MICROLARYNGOSCOPY WITH LASER AND BALLOON DILATION
Anesthesia: General | Site: Throat

## 2020-02-16 MED ORDER — SUGAMMADEX SODIUM 500 MG/5ML IV SOLN
INTRAVENOUS | Status: AC
  Filled 2020-02-16: qty 5

## 2020-02-16 MED ORDER — AMISULPRIDE (ANTIEMETIC) 5 MG/2ML IV SOLN: 10.0000 mg | Freq: Once | INTRAVENOUS | Status: DC | PRN

## 2020-02-16 MED ORDER — OXYCODONE HCL 5 MG/5ML PO SOLN: 5.0000 mg | Freq: Once | ORAL | Status: DC | PRN

## 2020-02-16 MED ORDER — ROCURONIUM BROMIDE 10 MG/ML (PF) SYRINGE
PREFILLED_SYRINGE | INTRAVENOUS | Status: AC
  Filled 2020-02-16: qty 10

## 2020-02-16 MED ORDER — DEXAMETHASONE SODIUM PHOSPHATE 10 MG/ML IJ SOLN
INTRAMUSCULAR | Status: DC | PRN
  Administered 2020-02-16: 10 mg via INTRAVENOUS

## 2020-02-16 MED ORDER — PROPOFOL 500 MG/50ML IV EMUL
INTRAVENOUS | Status: DC | PRN
  Administered 2020-02-16: 120 ug/kg/min via INTRAVENOUS

## 2020-02-16 MED ORDER — OXYCODONE HCL 5 MG PO TABS: 5.0000 mg | ORAL_TABLET | Freq: Once | ORAL | Status: DC | PRN

## 2020-02-16 MED ORDER — MEPERIDINE HCL 25 MG/ML IJ SOLN: 6.2500 mg | INTRAMUSCULAR | Status: DC | PRN

## 2020-02-16 MED ORDER — FENTANYL CITRATE (PF) 250 MCG/5ML IJ SOLN
INTRAMUSCULAR | Status: DC | PRN
  Administered 2020-02-16 (×2): 50 ug via INTRAVENOUS

## 2020-02-16 MED ORDER — SUGAMMADEX SODIUM 200 MG/2ML IV SOLN
INTRAVENOUS | Status: DC | PRN
  Administered 2020-02-16: 200 mg via INTRAVENOUS

## 2020-02-16 MED ORDER — LACTATED RINGERS IV SOLN: INTRAVENOUS | Status: DC

## 2020-02-16 MED ORDER — AMOXICILLIN 500 MG PO CAPS: 500.0000 mg | ORAL_CAPSULE | Freq: Three times a day (TID) | ORAL | 0 refills | Status: AC

## 2020-02-16 MED ORDER — ONDANSETRON HCL 4 MG/2ML IJ SOLN
INTRAMUSCULAR | Status: DC | PRN
  Administered 2020-02-16: 4 mg via INTRAVENOUS

## 2020-02-16 MED ORDER — ROCURONIUM BROMIDE 10 MG/ML (PF) SYRINGE
PREFILLED_SYRINGE | INTRAVENOUS | Status: DC | PRN
  Administered 2020-02-16: 70 mg via INTRAVENOUS

## 2020-02-16 MED ORDER — FENTANYL CITRATE (PF) 100 MCG/2ML IJ SOLN
INTRAMUSCULAR | Status: AC
  Filled 2020-02-16: qty 2

## 2020-02-16 MED ORDER — ONDANSETRON HCL 4 MG/2ML IJ SOLN
INTRAMUSCULAR | Status: AC
  Filled 2020-02-16: qty 2

## 2020-02-16 MED ORDER — PROPOFOL 10 MG/ML IV BOLUS
INTRAVENOUS | Status: AC
  Filled 2020-02-16: qty 20

## 2020-02-16 MED ORDER — LIDOCAINE 2% (20 MG/ML) 5 ML SYRINGE
INTRAMUSCULAR | Status: DC | PRN
  Administered 2020-02-16: 80 mg via INTRAVENOUS

## 2020-02-16 MED ORDER — HYDROMORPHONE HCL 1 MG/ML IJ SOLN: 0.2500 mg | INTRAMUSCULAR | Status: DC | PRN

## 2020-02-16 MED ORDER — PROMETHAZINE HCL 25 MG/ML IJ SOLN: 6.2500 mg | INTRAMUSCULAR | Status: DC | PRN

## 2020-02-16 MED ORDER — LIDOCAINE 2% (20 MG/ML) 5 ML SYRINGE
INTRAMUSCULAR | Status: AC
  Filled 2020-02-16: qty 5

## 2020-02-16 MED ORDER — PROPOFOL 10 MG/ML IV BOLUS
INTRAVENOUS | Status: DC | PRN
  Administered 2020-02-16: 40 mg via INTRAVENOUS
  Administered 2020-02-16: 150 mg via INTRAVENOUS

## 2020-02-16 SURGICAL SUPPLY — 41 items
BALLN PULM 12 13.5 15X75 (BALLOONS)
BALLN PULM 12 13.5 15X75CM (BALLOONS)
BALLN PULM 15 16.5 18 X 75CM (BALLOONS) ×1
BALLN PULM 15 16.5 18X75 (BALLOONS) ×2
BALLN PULMONARY 10 12MM (MISCELLANEOUS)
BALLN PULMONARY 10-12 (MISCELLANEOUS) IMPLANT
BALLN PULMONARY 8 10 OD75CM (MISCELLANEOUS)
BALLN PULMONARY 8-10 OD75 (MISCELLANEOUS) IMPLANT
BALLOON PULM 12 13.5 15X75 (BALLOONS) IMPLANT
BALLOON PULM 15 16.5 18X75 (BALLOONS) IMPLANT
BNDG EYE OVAL (GAUZE/BANDAGES/DRESSINGS) ×6 IMPLANT
CANISTER SUCT 1200ML W/VALVE (MISCELLANEOUS) ×3 IMPLANT
COVER WAND RF STERILE (DRAPES) IMPLANT
DEPRESSOR TONGUE BLADE STERILE (MISCELLANEOUS) ×3 IMPLANT
FILTER 7/8 IN (FILTER) IMPLANT
GAUZE SPONGE 4X4 12PLY STRL LF (GAUZE/BANDAGES/DRESSINGS) ×6 IMPLANT
GLOVE ECLIPSE 7.5 STRL STRAW (GLOVE) ×3 IMPLANT
GLOVE SURG UNDER POLY LF SZ7 (GLOVE) ×2 IMPLANT
GOWN STRL REUS W/ TWL LRG LVL3 (GOWN DISPOSABLE) IMPLANT
GOWN STRL REUS W/ TWL XL LVL3 (GOWN DISPOSABLE) IMPLANT
GOWN STRL REUS W/TWL LRG LVL3 (GOWN DISPOSABLE)
GOWN STRL REUS W/TWL XL LVL3 (GOWN DISPOSABLE)
GUARD TEETH (MISCELLANEOUS) ×3 IMPLANT
MARKER SKIN DUAL TIP RULER LAB (MISCELLANEOUS) IMPLANT
NEEDLE SPNL 22GX7 QUINCKE BK (NEEDLE) IMPLANT
NS IRRIG 1000ML POUR BTL (IV SOLUTION) ×3 IMPLANT
PACK BASIN DAY SURGERY FS (CUSTOM PROCEDURE TRAY) ×3 IMPLANT
PATTIES SURGICAL .5 X3 (DISPOSABLE) ×2 IMPLANT
REDUCTION FITTING 1/4 IN (FILTER) ×2 IMPLANT
SHEET MEDIUM DRAPE 40X70 STRL (DRAPES) ×3 IMPLANT
SLEEVE SCD COMPRESS KNEE MED (MISCELLANEOUS) ×3 IMPLANT
SOLUTION BUTLER CLEAR DIP (MISCELLANEOUS) IMPLANT
SURGILUBE 2OZ TUBE FLIPTOP (MISCELLANEOUS) IMPLANT
SYR 5ML LL (SYRINGE) IMPLANT
SYR CONTROL 10ML LL (SYRINGE) IMPLANT
SYR GAUGE ASSEMBLY (MISCELLANEOUS) ×1
SYR GAUGE ASSEMBLY ALLIANCE II (MISCELLANEOUS) ×2 IMPLANT
SYR TB 1ML LL NO SAFETY (SYRINGE) IMPLANT
TOWEL GREEN STERILE FF (TOWEL DISPOSABLE) ×3 IMPLANT
TUBE CONNECTING 20'X1/4 (TUBING) ×2
TUBE CONNECTING 20X1/4 (TUBING) ×3 IMPLANT

## 2020-02-16 NOTE — Discharge Instructions (Signed)

## 2020-02-16 NOTE — Transfer of Care (Signed)
Immediate Anesthesia Transfer of Care Note  Patient: Victoria Mccormick  Procedure(s) Performed: MICROLARYNGOSCOPY WITH BALLOON DILATION (N/A Throat)  Patient Location: PACU  Anesthesia Type:General  Level of Consciousness: awake, alert , oriented and patient cooperative  Airway & Oxygen Therapy: Patient Spontanous Breathing and Patient connected to face mask oxygen  Post-op Assessment: Report given to RN, Post -op Vital signs reviewed and stable and Patient moving all extremities  Post vital signs: Reviewed and stable   Last Vitals:  Vitals Value Taken Time  BP 162/78 02/16/20 0931  Temp    Pulse 61 02/16/20 0933  Resp 16 02/16/20 0933  SpO2 100 % 02/16/20 0933  Vitals shown include unvalidated device data.  Last Pain:  Vitals:   02/16/20 0757  TempSrc: Oral  PainSc: 0-No pain         Complications: No complications documented.

## 2020-02-16 NOTE — Anesthesia Procedure Notes (Addendum)
Procedure Name: Intubation Performed by: Myna Bright, CRNA Pre-anesthesia Checklist: Patient identified, Emergency Drugs available, Suction available and Patient being monitored Patient Re-evaluated:Patient Re-evaluated prior to induction Oxygen Delivery Method: Circle System Utilized Preoxygenation: Pre-oxygenation with 100% oxygen Induction Type: IV induction Ventilation: Mask ventilation without difficulty Laryngoscope Size: 3 and Mac Grade View: Grade II Tube type: Oral Tube size: 4.5 mm Number of attempts: 1 Airway Equipment and Method: Stylet and Oral airway Placement Confirmation: ETT inserted through vocal cords under direct vision,  positive ETCO2 and breath sounds checked- equal and bilateral Secured at: 21 cm Tube secured with: Tape Dental Injury: Teeth and Oropharynx as per pre-operative assessment  Comments: Easy mask airway. DL x 1 with MAC 3. Grade II view. Minimal resistance felt on placing 4.5 ETT. Atraumatic oral intubation.

## 2020-02-16 NOTE — Interval H&P Note (Signed)
History and Physical Interval Note:  02/16/2020 7:57 AM  Victoria Mccormick  has presented today for surgery, with the diagnosis of Subglottic Stenosis.  The various methods of treatment have been discussed with the patient and family. After consideration of risks, benefits and other options for treatment, the patient has consented to  Procedure(s): MICROLARYNGOSCOPY WITH BALLOON DILATION (N/A) as a surgical intervention.  The patient's history has been reviewed, patient examined, no change in status, stable for surgery.  I have reviewed the patient's chart and labs.  Questions were answered to the patient's satisfaction.     [name]

## 2020-02-16 NOTE — Op Note (Signed)
OPERATIVE REPORT  DATE OF SURGERY: 02/16/2020  PATIENT:  Victoria Mccormick,  69 y.o. female  PRE-OPERATIVE DIAGNOSIS:  Subglottic Stenosis  POST-OPERATIVE DIAGNOSIS:  Subglottic Stenosis  PROCEDURE:  Procedure(s): MICROLARYNGOSCOPY WITH CO2 LASER AND BALLOON DILATION  SURGEON:  Beckie Salts, MD  ASSISTANTS: none  ANESTHESIA:   General   EBL: Less than 10 ml  DRAINS: none  LOCAL MEDICATIONS USED:  None  SPECIMEN: None  COUNTS:  Correct  PROCEDURE DETAILS: The patient was taken to the operating room and placed on the operating table in the supine position. Following induction of general endotracheal anesthesia, the table was turned 90 and the patient was draped in a standard fashion.  A maxillary tooth protector was used throughout the procedure.  A Jako laser safe laryngoscope was used to visualize the larynx and attached to the Mayo stand with the suspension apparatus.  The operating microscope was brought into the operative field and CO2 laser attachment was fixed into place.  Wet eye pads and facial towels were used for laser protection.  Laser was set at 4 W continuous power.  Using apneic technique, the 4.5 endotracheal tube was removed and easily replaced through the laryngoscope.  It was removed as needed for the procedure.  The laser was used to create radial cuts through the subglottic stenosis segment that was identified.  Cuts were created at 9:00 10:00 1:00 and 3:00.  Suction was used to remove any char.  The CRE balloon dilator was then used at a setting of 4.5 atm which is equivalent to 16.5 mm dilation.  This was done on 3 separate times.  A #7 endotracheal tube was then placed relatively easily.  The care of the patient was then placed back to anesthesia in the laryngoscope was removed.  Patient was awakened extubated and transferred to recovery in stable condition.    PATIENT DISPOSITION:  To PACU, stable

## 2020-02-16 NOTE — Anesthesia Postprocedure Evaluation (Signed)
Anesthesia Post Note  Patient: Victoria Mccormick  Procedure(s) Performed: MICROLARYNGOSCOPY WITH BALLOON DILATION (N/A Throat)     Patient location during evaluation: PACU Anesthesia Type: General Level of consciousness: awake and alert Pain management: pain level controlled Vital Signs Assessment: post-procedure vital signs reviewed and stable Respiratory status: spontaneous breathing, nonlabored ventilation and respiratory function stable Cardiovascular status: blood pressure returned to baseline and stable Postop Assessment: no apparent nausea or vomiting Anesthetic complications: no   No complications documented.  Last Vitals:  Vitals:   02/16/20 0945 02/16/20 1020  BP: (!) 164/70 (!) 152/73  Pulse: (!) 58 63  Resp: 14 18  Temp:  36.7 C  SpO2: 100% 97%    Last Pain:  Vitals:   02/16/20 1020  TempSrc:   PainSc: 1                  Lynda Rainwater

## 2020-02-16 NOTE — Anesthesia Preprocedure Evaluation (Signed)
Anesthesia Evaluation  Patient identified by MRN, date of birth, ID band Patient awake    Reviewed: Allergy & Precautions, NPO status , Patient's Chart, lab work & pertinent test results, reviewed documented beta blocker date and time   Airway Mallampati: IV  TM Distance: >3 FB Neck ROM: Full    Dental no notable dental hx.    Pulmonary former smoker,    Pulmonary exam normal breath sounds clear to auscultation       Cardiovascular hypertension, Pt. on medications and Pt. on home beta blockers Normal cardiovascular exam Rhythm:Regular Rate:Normal     Neuro/Psych negative neurological ROS  negative psych ROS   GI/Hepatic negative GI ROS, Neg liver ROS,   Endo/Other  diabetes, Poorly Controlled, Oral Hypoglycemic Agents  Renal/GU negative Renal ROS     Musculoskeletal  (+) Arthritis , Osteoarthritis,    Abdominal   Peds  Hematology negative hematology ROS (+)   Anesthesia Other Findings SUBGLOTIC STENOSIS  Reproductive/Obstetrics                             Anesthesia Physical  Anesthesia Plan  ASA: III  Anesthesia Plan: General   Post-op Pain Management:    Induction: Intravenous  PONV Risk Score and Plan: 3 and Ondansetron, Dexamethasone, Midazolam and Treatment may vary due to age or medical condition  Airway Management Planned: Oral ETT  Additional Equipment:   Intra-op Plan:   Post-operative Plan: Extubation in OR  Informed Consent: I have reviewed the patients History and Physical, chart, labs and discussed the procedure including the risks, benefits and alternatives for the proposed anesthesia with the patient or authorized representative who has indicated his/her understanding and acceptance.     Dental advisory given  Plan Discussed with: CRNA  Anesthesia Plan Comments:         Anesthesia Quick Evaluation

## 2020-02-17 ENCOUNTER — Encounter (HOSPITAL_BASED_OUTPATIENT_CLINIC_OR_DEPARTMENT_OTHER): Payer: Self-pay | Admitting: Otolaryngology

## 2020-04-19 ENCOUNTER — Other Ambulatory Visit: Payer: Self-pay | Admitting: Family Medicine

## 2020-04-19 DIAGNOSIS — Z1231 Encounter for screening mammogram for malignant neoplasm of breast: Secondary | ICD-10-CM

## 2020-04-29 DIAGNOSIS — M1712 Unilateral primary osteoarthritis, left knee: Secondary | ICD-10-CM | POA: Diagnosis not present

## 2020-05-14 DIAGNOSIS — Z1231 Encounter for screening mammogram for malignant neoplasm of breast: Secondary | ICD-10-CM

## 2020-05-21 ENCOUNTER — Other Ambulatory Visit: Payer: Self-pay

## 2020-05-21 ENCOUNTER — Ambulatory Visit
Admission: RE | Admit: 2020-05-21 | Discharge: 2020-05-21 | Disposition: A | Discharge status: Home / Self Care | Payer: Medicare Other | Source: Ambulatory Visit | Attending: Family Medicine | Admitting: Family Medicine

## 2020-05-21 DIAGNOSIS — Z1231 Encounter for screening mammogram for malignant neoplasm of breast: Secondary | ICD-10-CM | POA: Diagnosis not present

## 2020-07-12 DIAGNOSIS — E119 Type 2 diabetes mellitus without complications: Secondary | ICD-10-CM | POA: Diagnosis not present

## 2020-07-20 DIAGNOSIS — H524 Presbyopia: Secondary | ICD-10-CM | POA: Diagnosis not present

## 2020-07-20 DIAGNOSIS — E119 Type 2 diabetes mellitus without complications: Secondary | ICD-10-CM | POA: Diagnosis not present

## 2020-07-20 DIAGNOSIS — H0012 Chalazion right lower eyelid: Secondary | ICD-10-CM | POA: Diagnosis not present

## 2020-07-20 DIAGNOSIS — H5201 Hypermetropia, right eye: Secondary | ICD-10-CM | POA: Diagnosis not present

## 2020-07-23 DIAGNOSIS — Z1389 Encounter for screening for other disorder: Secondary | ICD-10-CM | POA: Diagnosis not present

## 2020-07-23 DIAGNOSIS — N183 Chronic kidney disease, stage 3 unspecified: Secondary | ICD-10-CM | POA: Diagnosis not present

## 2020-07-23 DIAGNOSIS — Z0000 Encounter for general adult medical examination without abnormal findings: Secondary | ICD-10-CM | POA: Diagnosis not present

## 2020-07-23 DIAGNOSIS — Z23 Encounter for immunization: Secondary | ICD-10-CM | POA: Diagnosis not present

## 2020-07-23 DIAGNOSIS — K219 Gastro-esophageal reflux disease without esophagitis: Secondary | ICD-10-CM | POA: Diagnosis not present

## 2020-07-23 DIAGNOSIS — E1129 Type 2 diabetes mellitus with other diabetic kidney complication: Secondary | ICD-10-CM | POA: Diagnosis not present

## 2020-07-23 DIAGNOSIS — I129 Hypertensive chronic kidney disease with stage 1 through stage 4 chronic kidney disease, or unspecified chronic kidney disease: Secondary | ICD-10-CM | POA: Diagnosis not present

## 2020-07-23 DIAGNOSIS — E7800 Pure hypercholesterolemia, unspecified: Secondary | ICD-10-CM | POA: Diagnosis not present

## 2020-08-18 DIAGNOSIS — Z1211 Encounter for screening for malignant neoplasm of colon: Secondary | ICD-10-CM | POA: Diagnosis not present

## 2020-08-19 DIAGNOSIS — M1712 Unilateral primary osteoarthritis, left knee: Secondary | ICD-10-CM | POA: Diagnosis not present

## 2020-08-23 ENCOUNTER — Encounter (HOSPITAL_BASED_OUTPATIENT_CLINIC_OR_DEPARTMENT_OTHER): Payer: Self-pay | Admitting: Otolaryngology

## 2020-08-23 ENCOUNTER — Other Ambulatory Visit: Payer: Self-pay

## 2020-08-26 ENCOUNTER — Encounter (HOSPITAL_BASED_OUTPATIENT_CLINIC_OR_DEPARTMENT_OTHER)
Admission: RE | Admit: 2020-08-26 | Discharge: 2020-08-26 | Disposition: A | Discharge status: Home / Self Care | Payer: Medicare Other | Source: Ambulatory Visit | Attending: Otolaryngology | Admitting: Otolaryngology

## 2020-08-26 DIAGNOSIS — Z01818 Encounter for other preprocedural examination: Secondary | ICD-10-CM | POA: Diagnosis not present

## 2020-08-26 LAB — BASIC METABOLIC PANEL
Anion gap: 10 (ref 5–15)
BUN: 14 mg/dL (ref 8–23)
CO2: 30 mmol/L (ref 22–32)
Calcium: 9.2 mg/dL (ref 8.9–10.3)
Chloride: 98 mmol/L (ref 98–111)
Creatinine, Ser: 0.95 mg/dL (ref 0.44–1.00)
GFR, Estimated: >60 mL/min (ref >60)
Glucose, Bld: 124 mg/dL — ABNORMAL HIGH (ref 70–99)
Potassium: 3.9 mmol/L (ref 3.5–5.1)
Sodium: 138 mmol/L (ref 135–145)

## 2020-08-31 NOTE — H&P (Signed)
Complaint: Stridor HPI: Long history of subglottic stenosis, having worsening stridor and exercise intolerance.          Past Medical History:  Diagnosis Date   Arthritis     Complication of anesthesia 12/2015    states "jaw hyperextended and now has TMJ issues"   Complication of anesthesia      generalized muscle weakness after anesthesia for many days   Diabetes mellitus     GERD (gastroesophageal reflux disease)     Hyperlipemia     Hypertension     Subglottic stenosis     Wears glasses      reading               Past Surgical History:  Procedure Laterality Date   CESAREAN SECTION        x2   DIRECT LARYNGOSCOPY        glottic stenosis dilated-multiple times-2003to -2013 #8   KNEE ARTHROSCOPY Left 12/2015   MICROLARYNGOSCOPY WITH DILATION N/A 09/01/2016    Procedure: MICROLARYNGOSCOPY WITH BALLOON DILATION;  Surgeon: [name], MD;  Location: [facility];  Service: ENT;  Laterality: N/A;   MICROLARYNGOSCOPY WITH LASER AND BALLOON DILATION N/A 08/15/2012    Procedure: MICROLARYNGOSCOPY WITH LASER AND BALLOON DILATION;  Surgeon: [name], MD;  Location: [facility];  Service: ENT;  Laterality: N/A;  Tracheal Dilation   MICROLARYNGOSCOPY WITH LASER AND BALLOON DILATION N/A 01/31/2013    Procedure: MICROLARYNGOSCOPY WITH LASER AND BALLOON DILATION;  Surgeon: [name], MD;  Location: [facility];  Service: ENT;  Laterality: N/A;   MICROLARYNGOSCOPY WITH LASER AND BALLOON DILATION N/A 08/18/2013    Procedure: MICROLARYNGOSCOPY WITH LASER AND BALLOON DILATION;  Surgeon: [name], MD;  Location: [facility];  Service: ENT;  Laterality: N/A;   MICROLARYNGOSCOPY WITH LASER AND BALLOON DILATION N/A 08/27/2014    Procedure: MICROLARYNGOSCOPY WITH LASER AND BALLOON DILATION;  Surgeon: [name], MD;  Location: [facility];  Service: ENT;  Laterality: N/A;   MICROLARYNGOSCOPY WITH LASER AND BALLOON DILATION N/A  07/15/2015    Procedure: MICROLARYNGOSCOPY WITH LASER AND BALLOON DILATION;  Surgeon: [name], MD;  Location: [facility];  Service: ENT;  Laterality: N/A;   MICROLARYNGOSCOPY WITH LASER AND BALLOON DILATION N/A 01/28/2016    Procedure: MICROLARYNGOSCOPY WITH LASER AND BALLOON DILATION;  Surgeon: [name], MD;  Location: [facility];  Service: ENT;  Laterality: N/A;   MICROLARYNGOSCOPY WITH LASER AND BALLOON DILATION N/A 05/18/2017    Procedure: MICROLARYNGOSCOPY WITH LASER AND BALLOON DILATION;  Surgeon: [name], MD;  Location: [facility] [facility];  Service: ENT;  Laterality: N/A;   MICROLARYNGOSCOPY WITH LASER AND BALLOON DILATION N/A 01/28/2018    Procedure: MICROLARYNGOSCOPY with laser balloon dilation;  Surgeon: [name], MD;  Location: [facility];  Service: ENT;  Laterality: N/A;      No family history on file. Social History:  reports that she has quit smoking. She has never used smokeless tobacco. She reports current alcohol use. She reports that she does not use drugs.   Allergies: No Known Allergies   No medications prior to admission.      Lab Results Last 48 Hours  No results found for this or any previous visit (from the past 48 hour(s)).    Imaging Results (Last 48 hours)  No results found.      ROS: otherwise negative   There were no vitals taken for  this visit.   PHYSICAL EXAM: Overall appearance:  Healthy appearing, inspiratory stridor present. Head:  Normocephalic, atraumatic. Ears: External auditory canals are clear; tympanic membranes are intact and the middle ears are free of any effusion. Nose: External nose is healthy in appearance. Internal nasal exam free of any lesions or obstruction. Oral Cavity/pharynx:  There are no mucosal lesions or masses identified. Hypopharynx/Larynx: no signs of any mucosal lesions or masses identified. Vocal cords move normally. Neuro:  No identifiable neurologic  deficits. Neck: No palpable neck masses.   Studies Reviewed: none       Assessment/Plan Revision microlaryngoscopy with laser excision and balloon dilation of subglottic stenosis

## 2020-09-01 ENCOUNTER — Other Ambulatory Visit: Payer: Self-pay

## 2020-09-01 ENCOUNTER — Ambulatory Visit (HOSPITAL_BASED_OUTPATIENT_CLINIC_OR_DEPARTMENT_OTHER)
Admission: RE | Admit: 2020-09-01 | Discharge: 2020-09-01 | Disposition: A | Discharge status: Home / Self Care | Payer: Medicare Other | Source: Ambulatory Visit | Attending: Otolaryngology | Admitting: Otolaryngology

## 2020-09-01 ENCOUNTER — Ambulatory Visit (HOSPITAL_BASED_OUTPATIENT_CLINIC_OR_DEPARTMENT_OTHER): Payer: Medicare Other | Admitting: Anesthesiology

## 2020-09-01 ENCOUNTER — Encounter (HOSPITAL_BASED_OUTPATIENT_CLINIC_OR_DEPARTMENT_OTHER): Payer: Self-pay | Admitting: Otolaryngology

## 2020-09-01 ENCOUNTER — Encounter (HOSPITAL_BASED_OUTPATIENT_CLINIC_OR_DEPARTMENT_OTHER)
Admission: RE | Disposition: A | Discharge status: Home / Self Care | Payer: Self-pay | Source: Ambulatory Visit | Attending: Otolaryngology

## 2020-09-01 DIAGNOSIS — J386 Stenosis of larynx: Secondary | ICD-10-CM | POA: Diagnosis not present

## 2020-09-01 DIAGNOSIS — E119 Type 2 diabetes mellitus without complications: Secondary | ICD-10-CM | POA: Diagnosis not present

## 2020-09-01 DIAGNOSIS — I10 Essential (primary) hypertension: Secondary | ICD-10-CM | POA: Diagnosis not present

## 2020-09-01 DIAGNOSIS — Z87891 Personal history of nicotine dependence: Secondary | ICD-10-CM | POA: Insufficient documentation

## 2020-09-01 DIAGNOSIS — E1165 Type 2 diabetes mellitus with hyperglycemia: Secondary | ICD-10-CM | POA: Diagnosis not present

## 2020-09-01 DIAGNOSIS — K219 Gastro-esophageal reflux disease without esophagitis: Secondary | ICD-10-CM | POA: Insufficient documentation

## 2020-09-01 DIAGNOSIS — Z7984 Long term (current) use of oral hypoglycemic drugs: Secondary | ICD-10-CM | POA: Diagnosis not present

## 2020-09-01 HISTORY — PX: MICROLARYNGOSCOPY WITH LASER AND BALLOON DILATION: SHX5973

## 2020-09-01 LAB — GLUCOSE, CAPILLARY
Glucose-Capillary: 159 mg/dL — ABNORMAL HIGH (ref 70–99)
Glucose-Capillary: 133 mg/dL — ABNORMAL HIGH (ref 70–99)

## 2020-09-01 SURGERY — MICROLARYNGOSCOPY WITH LASER AND BALLOON DILATION
Anesthesia: General | Site: Throat

## 2020-09-01 MED ORDER — AMISULPRIDE (ANTIEMETIC) 5 MG/2ML IV SOLN: 10.0000 mg | Freq: Once | INTRAVENOUS | Status: DC | PRN

## 2020-09-01 MED ORDER — PROPOFOL 10 MG/ML IV BOLUS
INTRAVENOUS | Status: AC
  Filled 2020-09-01: qty 20

## 2020-09-01 MED ORDER — PROPOFOL 500 MG/50ML IV EMUL
INTRAVENOUS | Status: DC | PRN
  Administered 2020-09-01: 150 ug/kg/min via INTRAVENOUS

## 2020-09-01 MED ORDER — MEPERIDINE HCL 25 MG/ML IJ SOLN: 6.2500 mg | INTRAMUSCULAR | Status: DC | PRN

## 2020-09-01 MED ORDER — PROPOFOL 10 MG/ML IV BOLUS
INTRAVENOUS | Status: DC | PRN
  Administered 2020-09-01: 150 mg via INTRAVENOUS
  Administered 2020-09-01: 50 mg via INTRAVENOUS

## 2020-09-01 MED ORDER — LIDOCAINE HCL (CARDIAC) PF 100 MG/5ML IV SOSY
PREFILLED_SYRINGE | INTRAVENOUS | Status: DC | PRN
  Administered 2020-09-01: 80 mg via INTRAVENOUS

## 2020-09-01 MED ORDER — MIDAZOLAM HCL 5 MG/5ML IJ SOLN
INTRAMUSCULAR | Status: DC | PRN
  Administered 2020-09-01: 2 mg via INTRAVENOUS

## 2020-09-01 MED ORDER — FENTANYL CITRATE (PF) 100 MCG/2ML IJ SOLN
INTRAMUSCULAR | Status: DC | PRN
  Administered 2020-09-01: 100 ug via INTRAVENOUS

## 2020-09-01 MED ORDER — LIDOCAINE HCL (PF) 2 % IJ SOLN
INTRAMUSCULAR | Status: AC
  Filled 2020-09-01: qty 5

## 2020-09-01 MED ORDER — MIDAZOLAM HCL 2 MG/2ML IJ SOLN
INTRAMUSCULAR | Status: AC
  Filled 2020-09-01: qty 2

## 2020-09-01 MED ORDER — ROCURONIUM BROMIDE 10 MG/ML (PF) SYRINGE
PREFILLED_SYRINGE | INTRAVENOUS | Status: AC
  Filled 2020-09-01: qty 10

## 2020-09-01 MED ORDER — OXYCODONE HCL 5 MG PO TABS: 5.0000 mg | ORAL_TABLET | Freq: Once | ORAL | Status: DC | PRN

## 2020-09-01 MED ORDER — OXYCODONE HCL 5 MG/5ML PO SOLN
5.0000 mg | Freq: Once | ORAL | Status: DC | PRN
Start: 2020-09-01 — End: 2020-09-01

## 2020-09-01 MED ORDER — PROMETHAZINE HCL 25 MG/ML IJ SOLN: 6.2500 mg | INTRAMUSCULAR | Status: DC | PRN

## 2020-09-01 MED ORDER — FENTANYL CITRATE (PF) 100 MCG/2ML IJ SOLN
INTRAMUSCULAR | Status: AC
  Filled 2020-09-01: qty 2

## 2020-09-01 MED ORDER — LACTATED RINGERS IV SOLN: INTRAVENOUS | Status: DC

## 2020-09-01 MED ORDER — HYDROMORPHONE HCL 1 MG/ML IJ SOLN: 0.2500 mg | INTRAMUSCULAR | Status: DC | PRN

## 2020-09-01 MED ORDER — ONDANSETRON HCL 4 MG/2ML IJ SOLN
INTRAMUSCULAR | Status: DC | PRN
  Administered 2020-09-01: 4 mg via INTRAVENOUS

## 2020-09-01 MED ORDER — ROCURONIUM BROMIDE 100 MG/10ML IV SOLN
INTRAVENOUS | Status: DC | PRN
  Administered 2020-09-01: 80 mg via INTRAVENOUS

## 2020-09-01 MED ORDER — SUGAMMADEX SODIUM 500 MG/5ML IV SOLN
INTRAVENOUS | Status: DC | PRN
  Administered 2020-09-01: 500 mg via INTRAVENOUS

## 2020-09-01 MED ORDER — CEPHALEXIN 500 MG PO CAPS: 500.0000 mg | ORAL_CAPSULE | Freq: Three times a day (TID) | ORAL | 0 refills | Status: DC

## 2020-09-01 MED ORDER — DEXAMETHASONE SODIUM PHOSPHATE 4 MG/ML IJ SOLN
INTRAMUSCULAR | Status: DC | PRN
  Administered 2020-09-01: 4 mg via INTRAVENOUS

## 2020-09-01 SURGICAL SUPPLY — 36 items
BALLN PULM 15 16.5 18 X 75CM (BALLOONS) ×1
BALLN PULM 15 16.5 18X75 (BALLOONS) ×2
BALLN PULMONARY 10 12MM (MISCELLANEOUS)
BALLN PULMONARY 10-12 (MISCELLANEOUS) IMPLANT
BALLN PULMONARY 8 10 OD75CM (MISCELLANEOUS)
BALLN PULMONARY 8-10 OD75 (MISCELLANEOUS) IMPLANT
BALLOON PULM 15 16.5 18X75 (BALLOONS) IMPLANT
BNDG EYE OVAL (GAUZE/BANDAGES/DRESSINGS) ×6 IMPLANT
CANISTER SUCT 1200ML W/VALVE (MISCELLANEOUS) ×3 IMPLANT
COVER WAND RF STERILE (DRAPES) IMPLANT
DEPRESSOR TONGUE BLADE STERILE (MISCELLANEOUS) ×3 IMPLANT
GAUZE SPONGE 4X4 12PLY STRL LF (GAUZE/BANDAGES/DRESSINGS) ×6 IMPLANT
GLOVE SURG LTX SZ7.5 (GLOVE) ×3 IMPLANT
GOWN STRL REUS W/ TWL LRG LVL3 (GOWN DISPOSABLE) IMPLANT
GOWN STRL REUS W/ TWL XL LVL3 (GOWN DISPOSABLE) IMPLANT
GOWN STRL REUS W/TWL LRG LVL3 (GOWN DISPOSABLE)
GOWN STRL REUS W/TWL XL LVL3 (GOWN DISPOSABLE)
GUARD TEETH (MISCELLANEOUS) ×2 IMPLANT
MARKER SKIN DUAL TIP RULER LAB (MISCELLANEOUS) IMPLANT
NDL SPNL 22GX7 QUINCKE BK (NEEDLE) IMPLANT
NEEDLE SPNL 22GX7 QUINCKE BK (NEEDLE) IMPLANT
NS IRRIG 1000ML POUR BTL (IV SOLUTION) ×3 IMPLANT
PACK BASIN DAY SURGERY FS (CUSTOM PROCEDURE TRAY) ×3 IMPLANT
PATTIES SURGICAL .5 X3 (DISPOSABLE) ×3 IMPLANT
SHEET MEDIUM DRAPE 40X70 STRL (DRAPES) ×3 IMPLANT
SLEEVE SCD COMPRESS KNEE MED (STOCKING) ×2 IMPLANT
SOLUTION BUTLER CLEAR DIP (MISCELLANEOUS) IMPLANT
SURGILUBE 2OZ TUBE FLIPTOP (MISCELLANEOUS) ×2 IMPLANT
SYR 5ML LL (SYRINGE) IMPLANT
SYR CONTROL 10ML LL (SYRINGE) IMPLANT
SYR GAUGE ASSEMBLY (MISCELLANEOUS)
SYR GAUGE ASSEMBLY ALLIANCE II (MISCELLANEOUS) IMPLANT
SYR TB 1ML LL NO SAFETY (SYRINGE) IMPLANT
TOWEL GREEN STERILE FF (TOWEL DISPOSABLE) ×3 IMPLANT
TUBE CONNECTING 20'X1/4 (TUBING) ×1
TUBE CONNECTING 20X1/4 (TUBING) ×2 IMPLANT

## 2020-09-01 NOTE — Transfer of Care (Signed)
Immediate Anesthesia Transfer of Care Note  Patient: Victoria Mccormick  Procedure(s) Performed: Microlaryngoscopy with Laser and Balloon Dilation (Throat)  Patient Location: PACU  Anesthesia Type:General  Level of Consciousness: sedated  Airway & Oxygen Therapy: Patient Spontanous Breathing and Patient connected to face mask oxygen  Post-op Assessment: Report given to RN and Post -op Vital signs reviewed and stable  Post vital signs: Reviewed and stable  Last Vitals:  Vitals Value Taken Time  BP 163/79 09/01/20 1135  Temp    Pulse 63 09/01/20 1136  Resp 11 09/01/20 1136  SpO2 99 % 09/01/20 1136  Vitals shown include unvalidated device data.  Last Pain:  Vitals:   09/01/20 0939  TempSrc: Oral  PainSc: 0-No pain         Complications: No notable events documented.

## 2020-09-01 NOTE — Anesthesia Postprocedure Evaluation (Signed)
Anesthesia Post Note  Patient: Victoria Mccormick  Procedure(s) Performed: Microlaryngoscopy with Laser and Balloon Dilation (Throat)     Patient location during evaluation: PACU Anesthesia Type: General Level of consciousness: awake and alert Pain management: pain level controlled Vital Signs Assessment: post-procedure vital signs reviewed and stable Respiratory status: spontaneous breathing, nonlabored ventilation and respiratory function stable Cardiovascular status: blood pressure returned to baseline and stable Postop Assessment: no apparent nausea or vomiting Anesthetic complications: no   No notable events documented.  Last Vitals:  Vitals:   09/01/20 1215 09/01/20 1240  BP: (!) 168/75 (!) 185/78  Pulse: (!) 55 (!) 57  Resp: 14 18  Temp:  36.4 C  SpO2: 98% 96%    Last Pain:  Vitals:   09/01/20 1240  TempSrc:   PainSc: 2                  Lynda Rainwater

## 2020-09-01 NOTE — Op Note (Signed)
OPERATIVE REPORT  DATE OF SURGERY: 09/01/2020  PATIENT:  Victoria Mccormick,  70 y.o. female  PRE-OPERATIVE DIAGNOSIS:  Subglottic Stenosis  POST-OPERATIVE DIAGNOSIS:  Subglottic Stenosis  PROCEDURE:  Procedure(s): Microlaryngoscopy with Laser and Balloon Dilation  SURGEON:  Beckie Salts, MD  ASSISTANTS: None  ANESTHESIA:   General   EBL: Less than 10 ml  DRAINS: None  LOCAL MEDICATIONS USED:  None  SPECIMEN:  none  COUNTS:  Correct  PROCEDURE DETAILS: The patient was taken to the operating room and placed on the operating table in the supine position. Following induction of general endotracheal anesthesia, using a 4.5 endotracheal tube, the table was turned 90 degrees and the patient was draped for standard operative endoscopy including saline soaked eye pads and saline soaked towels around the face.  A maxillary tooth protector was used throughout the case.  A Jako laryngoscope was entered into the oral cavity used to evaluate the larynx and attached to the Mayo stand with the suspension apparatus.  The endotracheal tube was removed and the airway was inspected.  The tube was then replaced.  Throughout the case the tube was removed and replaced as needed following the direction of anesthesia.  Using the operating microscope the subglottic area was inspected.  There was a ring of circumferential stenotic tissue that was then lasered using the CO2 laser attachment on the microscope at a setting of 2 W continuous power.  Radial cuts were created at 12:00 10:00 8:00 and 6:00.  On the right side there was minimal stenosis.  The CRE balloon dilator was then used.  This was inflated up to 4.5 atm was corresponded to 16.5 mm diameter.  This was performed on 4 occasions making sure that the balloon was in the correct position.  At 1 point I thought I may have seen a polypoid growth on the right side just below the stenotic segment but I was unable to identify this area on subsequent looks.   At the termination procedure a 7.0 endotracheal tube was placed.  Care with the patient was then taken over by anesthesia.  The endoscope was removed.  Patient was awakened extubated and transferred to recovery in stable condition.    PATIENT DISPOSITION:  To PACU, stable

## 2020-09-01 NOTE — Anesthesia Preprocedure Evaluation (Signed)
Anesthesia Evaluation  Patient identified by MRN, date of birth, ID band Patient awake    Reviewed: Allergy & Precautions, NPO status , Patient's Chart, lab work & pertinent test results, reviewed documented beta blocker date and time   Airway Mallampati: IV  TM Distance: >3 FB Neck ROM: Full    Dental no notable dental hx.    Pulmonary former smoker,    Pulmonary exam normal breath sounds clear to auscultation       Cardiovascular hypertension, Pt. on medications and Pt. on home beta blockers Normal cardiovascular exam Rhythm:Regular Rate:Normal     Neuro/Psych negative neurological ROS  negative psych ROS   GI/Hepatic negative GI ROS, Neg liver ROS,   Endo/Other  diabetes, Poorly Controlled, Oral Hypoglycemic Agents  Renal/GU negative Renal ROS     Musculoskeletal  (+) Arthritis , Osteoarthritis,    Abdominal   Peds  Hematology negative hematology ROS (+)   Anesthesia Other Findings SUBGLOTIC STENOSIS  Reproductive/Obstetrics                             Anesthesia Physical  Anesthesia Plan  ASA: III  Anesthesia Plan: General   Post-op Pain Management:    Induction: Intravenous  PONV Risk Score and Plan: 3 and Ondansetron, Dexamethasone, Midazolam and Treatment may vary due to age or medical condition  Airway Management Planned: Oral ETT  Additional Equipment:   Intra-op Plan:   Post-operative Plan: Extubation in OR  Informed Consent: I have reviewed the patients History and Physical, chart, labs and discussed the procedure including the risks, benefits and alternatives for the proposed anesthesia with the patient or authorized representative who has indicated his/her understanding and acceptance.     Dental advisory given  Plan Discussed with: CRNA  Anesthesia Plan Comments:         Anesthesia Quick Evaluation

## 2020-09-01 NOTE — Discharge Instructions (Signed)

## 2020-09-01 NOTE — Anesthesia Procedure Notes (Signed)
Procedure Name: Intubation Date/Time: 09/01/2020 10:46 AM Performed by: Maryella Shivers, CRNA Pre-anesthesia Checklist: Patient identified, Emergency Drugs available, Suction available and Patient being monitored Patient Re-evaluated:Patient Re-evaluated prior to induction Oxygen Delivery Method: Circle system utilized Preoxygenation: Pre-oxygenation with 100% oxygen Induction Type: IV induction Ventilation: Mask ventilation without difficulty Laryngoscope Size: Mac and 3 Grade View: Grade II Tube size: 4.5 mm Number of attempts: 1 Airway Equipment and Method: Stylet and Oral airway Placement Confirmation: ETT inserted through vocal cords under direct vision, positive ETCO2 and breath sounds checked- equal and bilateral Secured at: 20 cm Tube secured with: Tape Dental Injury: Teeth and Oropharynx as per pre-operative assessment

## 2020-09-01 NOTE — Interval H&P Note (Signed)
History and Physical Interval Note:  09/01/2020 10:04 AM  Victoria Mccormick  has presented today for surgery, with the diagnosis of Subglottic Stenosis.  The various methods of treatment have been discussed with the patient and family. After consideration of risks, benefits and other options for treatment, the patient has consented to  Procedure(s): Microlaryngoscopy with Laser and Balloon Dilation (N/A) as a surgical intervention.  The patient's history has been reviewed, patient examined, no change in status, stable for surgery.  I have reviewed the patient's chart and labs.  Questions were answered to the patient's satisfaction.     [name]

## 2020-09-02 ENCOUNTER — Encounter (HOSPITAL_BASED_OUTPATIENT_CLINIC_OR_DEPARTMENT_OTHER): Payer: Self-pay | Admitting: Otolaryngology

## 2020-11-12 DIAGNOSIS — M1711 Unilateral primary osteoarthritis, right knee: Secondary | ICD-10-CM | POA: Diagnosis not present

## 2020-11-12 DIAGNOSIS — M170 Bilateral primary osteoarthritis of knee: Secondary | ICD-10-CM | POA: Diagnosis not present

## 2020-11-26 DIAGNOSIS — M1712 Unilateral primary osteoarthritis, left knee: Secondary | ICD-10-CM | POA: Diagnosis not present

## 2020-12-06 DIAGNOSIS — J069 Acute upper respiratory infection, unspecified: Secondary | ICD-10-CM | POA: Diagnosis not present

## 2020-12-06 DIAGNOSIS — I10 Essential (primary) hypertension: Secondary | ICD-10-CM | POA: Diagnosis not present

## 2021-01-26 DIAGNOSIS — K219 Gastro-esophageal reflux disease without esophagitis: Secondary | ICD-10-CM | POA: Diagnosis not present

## 2021-01-26 DIAGNOSIS — I129 Hypertensive chronic kidney disease with stage 1 through stage 4 chronic kidney disease, or unspecified chronic kidney disease: Secondary | ICD-10-CM | POA: Diagnosis not present

## 2021-01-26 DIAGNOSIS — N183 Chronic kidney disease, stage 3 unspecified: Secondary | ICD-10-CM | POA: Diagnosis not present

## 2021-01-26 DIAGNOSIS — E1129 Type 2 diabetes mellitus with other diabetic kidney complication: Secondary | ICD-10-CM | POA: Diagnosis not present

## 2021-01-26 DIAGNOSIS — E7800 Pure hypercholesterolemia, unspecified: Secondary | ICD-10-CM | POA: Diagnosis not present

## 2021-01-31 DIAGNOSIS — M1711 Unilateral primary osteoarthritis, right knee: Secondary | ICD-10-CM | POA: Diagnosis not present

## 2021-03-22 ENCOUNTER — Other Ambulatory Visit: Payer: Self-pay | Admitting: Family Medicine

## 2021-03-22 DIAGNOSIS — Z1231 Encounter for screening mammogram for malignant neoplasm of breast: Secondary | ICD-10-CM

## 2021-05-02 DIAGNOSIS — M170 Bilateral primary osteoarthritis of knee: Secondary | ICD-10-CM | POA: Diagnosis not present

## 2021-05-25 ENCOUNTER — Other Ambulatory Visit: Payer: Self-pay

## 2021-05-25 ENCOUNTER — Ambulatory Visit
Admission: RE | Admit: 2021-05-25 | Discharge: 2021-05-25 | Disposition: A | Discharge status: Home / Self Care | Payer: Medicare Other | Source: Ambulatory Visit | Attending: Family Medicine | Admitting: Family Medicine

## 2021-05-25 DIAGNOSIS — Z1231 Encounter for screening mammogram for malignant neoplasm of breast: Secondary | ICD-10-CM

## 2021-05-31 DIAGNOSIS — H9202 Otalgia, left ear: Secondary | ICD-10-CM | POA: Diagnosis not present

## 2021-06-16 DIAGNOSIS — J386 Stenosis of larynx: Secondary | ICD-10-CM | POA: Diagnosis not present

## 2021-06-28 ENCOUNTER — Encounter (HOSPITAL_BASED_OUTPATIENT_CLINIC_OR_DEPARTMENT_OTHER): Payer: Self-pay | Admitting: Otolaryngology

## 2021-06-28 ENCOUNTER — Other Ambulatory Visit: Payer: Self-pay

## 2021-06-29 ENCOUNTER — Encounter (HOSPITAL_BASED_OUTPATIENT_CLINIC_OR_DEPARTMENT_OTHER)
Admission: RE | Admit: 2021-06-29 | Discharge: 2021-06-29 | Disposition: A | Discharge status: Home / Self Care | Payer: Medicare Other | Source: Ambulatory Visit | Attending: Otolaryngology | Admitting: Otolaryngology

## 2021-06-29 DIAGNOSIS — Z01812 Encounter for preprocedural laboratory examination: Secondary | ICD-10-CM | POA: Insufficient documentation

## 2021-06-29 DIAGNOSIS — Z79899 Other long term (current) drug therapy: Secondary | ICD-10-CM | POA: Diagnosis not present

## 2021-06-29 LAB — BASIC METABOLIC PANEL
Anion gap: 7 (ref 5–15)
BUN: 19 mg/dL (ref 8–23)
CO2: 29 mmol/L (ref 22–32)
Calcium: 9.2 mg/dL (ref 8.9–10.3)
Chloride: 100 mmol/L (ref 98–111)
Creatinine, Ser: 1.01 mg/dL — ABNORMAL HIGH (ref 0.44–1.00)
GFR, Estimated: >60 mL/min (ref >60)
Glucose, Bld: 153 mg/dL — ABNORMAL HIGH (ref 70–99)
Potassium: 4.5 mmol/L (ref 3.5–5.1)
Sodium: 136 mmol/L (ref 135–145)

## 2021-07-06 ENCOUNTER — Encounter (HOSPITAL_BASED_OUTPATIENT_CLINIC_OR_DEPARTMENT_OTHER): Payer: Self-pay | Admitting: Otolaryngology

## 2021-07-06 ENCOUNTER — Encounter (HOSPITAL_BASED_OUTPATIENT_CLINIC_OR_DEPARTMENT_OTHER)
Admission: RE | Disposition: A | Discharge status: Home / Self Care | Payer: Self-pay | Source: Ambulatory Visit | Attending: Otolaryngology

## 2021-07-06 ENCOUNTER — Other Ambulatory Visit: Payer: Self-pay

## 2021-07-06 ENCOUNTER — Ambulatory Visit (HOSPITAL_BASED_OUTPATIENT_CLINIC_OR_DEPARTMENT_OTHER)
Admission: RE | Admit: 2021-07-06 | Discharge: 2021-07-06 | Disposition: A | Discharge status: Home / Self Care | Payer: Medicare Other | Source: Ambulatory Visit | Attending: Otolaryngology | Admitting: Otolaryngology

## 2021-07-06 ENCOUNTER — Ambulatory Visit (HOSPITAL_BASED_OUTPATIENT_CLINIC_OR_DEPARTMENT_OTHER): Payer: Medicare Other | Admitting: Anesthesiology

## 2021-07-06 DIAGNOSIS — E1165 Type 2 diabetes mellitus with hyperglycemia: Secondary | ICD-10-CM | POA: Diagnosis not present

## 2021-07-06 DIAGNOSIS — M1990 Unspecified osteoarthritis, unspecified site: Secondary | ICD-10-CM | POA: Insufficient documentation

## 2021-07-06 DIAGNOSIS — I10 Essential (primary) hypertension: Secondary | ICD-10-CM | POA: Diagnosis not present

## 2021-07-06 DIAGNOSIS — Z79899 Other long term (current) drug therapy: Secondary | ICD-10-CM | POA: Diagnosis not present

## 2021-07-06 DIAGNOSIS — J386 Stenosis of larynx: Secondary | ICD-10-CM | POA: Diagnosis not present

## 2021-07-06 DIAGNOSIS — E119 Type 2 diabetes mellitus without complications: Secondary | ICD-10-CM | POA: Insufficient documentation

## 2021-07-06 DIAGNOSIS — Z7984 Long term (current) use of oral hypoglycemic drugs: Secondary | ICD-10-CM

## 2021-07-06 DIAGNOSIS — Z87891 Personal history of nicotine dependence: Secondary | ICD-10-CM | POA: Diagnosis not present

## 2021-07-06 HISTORY — PX: MICROLARYNGOSCOPY WITH LASER AND BALLOON DILATION: SHX5973

## 2021-07-06 LAB — GLUCOSE, CAPILLARY
Glucose-Capillary: 169 mg/dL — ABNORMAL HIGH (ref 70–99)
Glucose-Capillary: 110 mg/dL — ABNORMAL HIGH (ref 70–99)

## 2021-07-06 SURGERY — MICROLARYNGOSCOPY WITH LASER AND BALLOON DILATION
Anesthesia: General

## 2021-07-06 MED ORDER — OXYCODONE HCL 5 MG PO TABS: 5.0000 mg | ORAL_TABLET | Freq: Once | ORAL | Status: DC | PRN

## 2021-07-06 MED ORDER — ONDANSETRON HCL 4 MG/2ML IJ SOLN
INTRAMUSCULAR | Status: DC | PRN
  Administered 2021-07-06: 4 mg via INTRAVENOUS

## 2021-07-06 MED ORDER — PROMETHAZINE HCL 25 MG/ML IJ SOLN: 6.2500 mg | INTRAMUSCULAR | Status: DC | PRN

## 2021-07-06 MED ORDER — LIDOCAINE 2% (20 MG/ML) 5 ML SYRINGE
INTRAMUSCULAR | Status: AC
  Filled 2021-07-06: qty 5

## 2021-07-06 MED ORDER — AMISULPRIDE (ANTIEMETIC) 5 MG/2ML IV SOLN: 10.0000 mg | Freq: Once | INTRAVENOUS | Status: DC | PRN

## 2021-07-06 MED ORDER — DEXAMETHASONE SODIUM PHOSPHATE 4 MG/ML IJ SOLN
INTRAMUSCULAR | Status: DC | PRN
  Administered 2021-07-06: 10 mg via INTRAVENOUS

## 2021-07-06 MED ORDER — PROPOFOL 500 MG/50ML IV EMUL
INTRAVENOUS | Status: DC | PRN
  Administered 2021-07-06: 150 ug/kg/min via INTRAVENOUS

## 2021-07-06 MED ORDER — PROPOFOL 10 MG/ML IV BOLUS
INTRAVENOUS | Status: AC
  Filled 2021-07-06: qty 20

## 2021-07-06 MED ORDER — ROCURONIUM BROMIDE 100 MG/10ML IV SOLN
INTRAVENOUS | Status: DC | PRN
  Administered 2021-07-06: 80 mg via INTRAVENOUS

## 2021-07-06 MED ORDER — SUGAMMADEX SODIUM 500 MG/5ML IV SOLN
INTRAVENOUS | Status: DC | PRN
  Administered 2021-07-06: 300 mg via INTRAVENOUS

## 2021-07-06 MED ORDER — LACTATED RINGERS IV SOLN: INTRAVENOUS | Status: DC

## 2021-07-06 MED ORDER — PROPOFOL 10 MG/ML IV BOLUS
INTRAVENOUS | Status: DC | PRN
  Administered 2021-07-06: 150 mg via INTRAVENOUS

## 2021-07-06 MED ORDER — ONDANSETRON HCL 4 MG/2ML IJ SOLN
INTRAMUSCULAR | Status: AC
  Filled 2021-07-06: qty 2

## 2021-07-06 MED ORDER — LIDOCAINE HCL (CARDIAC) PF 100 MG/5ML IV SOSY
PREFILLED_SYRINGE | INTRAVENOUS | Status: DC | PRN
  Administered 2021-07-06: 80 mg via INTRAVENOUS

## 2021-07-06 MED ORDER — MIDAZOLAM HCL 5 MG/5ML IJ SOLN
INTRAMUSCULAR | Status: DC | PRN
  Administered 2021-07-06: 2 mg via INTRAVENOUS

## 2021-07-06 MED ORDER — FENTANYL CITRATE (PF) 100 MCG/2ML IJ SOLN
INTRAMUSCULAR | Status: DC | PRN
  Administered 2021-07-06 (×2): 50 ug via INTRAVENOUS

## 2021-07-06 MED ORDER — OXYCODONE HCL 5 MG/5ML PO SOLN: 5.0000 mg | Freq: Once | ORAL | Status: DC | PRN

## 2021-07-06 MED ORDER — HYDROMORPHONE HCL 1 MG/ML IJ SOLN: 0.2500 mg | INTRAMUSCULAR | Status: DC | PRN

## 2021-07-06 MED ORDER — FENTANYL CITRATE (PF) 100 MCG/2ML IJ SOLN
INTRAMUSCULAR | Status: AC
Start: 2021-07-06 — End: ?
  Filled 2021-07-06: qty 2

## 2021-07-06 MED ORDER — MEPERIDINE HCL 25 MG/ML IJ SOLN: 6.2500 mg | INTRAMUSCULAR | Status: DC | PRN

## 2021-07-06 MED ORDER — MIDAZOLAM HCL 2 MG/2ML IJ SOLN
INTRAMUSCULAR | Status: AC
  Filled 2021-07-06: qty 2

## 2021-07-06 MED ORDER — DEXAMETHASONE SODIUM PHOSPHATE 10 MG/ML IJ SOLN
INTRAMUSCULAR | Status: AC
  Filled 2021-07-06: qty 1

## 2021-07-06 SURGICAL SUPPLY — 34 items
BALLN PULM 12 13.5 15X75 (BALLOONS)
BALLN PULM 15 16.5 18X75 (BALLOONS) ×2
BALLN PULMONARY 10-12 (MISCELLANEOUS) IMPLANT
BALLN PULMONARY 8-10 OD75 (MISCELLANEOUS) IMPLANT
BALLOON PULM 12 13.5 15X75 (BALLOONS) IMPLANT
BALLOON PULM 15 16.5 18X75 (BALLOONS) IMPLANT
BNDG EYE OVAL (GAUZE/BANDAGES/DRESSINGS) ×4 IMPLANT
CANISTER SUCT 1200ML W/VALVE (MISCELLANEOUS) ×2 IMPLANT
DEFOGGER MIRROR 1QT (MISCELLANEOUS) IMPLANT
DEPRESSOR TONGUE BLADE STERILE (MISCELLANEOUS) ×2 IMPLANT
GAUZE SPONGE 4X4 12PLY STRL LF (GAUZE/BANDAGES/DRESSINGS) ×4 IMPLANT
GLOVE SURG SYN 7.5  E (GLOVE) ×2
GLOVE SURG SYN 7.5 E (GLOVE) ×1 IMPLANT
GLOVE SURG SYN 7.5 PF PI (GLOVE) ×1 IMPLANT
GOWN STRL REUS W/ TWL LRG LVL3 (GOWN DISPOSABLE) IMPLANT
GOWN STRL REUS W/ TWL XL LVL3 (GOWN DISPOSABLE) IMPLANT
GOWN STRL REUS W/TWL LRG LVL3 (GOWN DISPOSABLE)
GOWN STRL REUS W/TWL XL LVL3 (GOWN DISPOSABLE)
GUARD TEETH (MISCELLANEOUS) ×2 IMPLANT
MARKER SKIN DUAL TIP RULER LAB (MISCELLANEOUS) IMPLANT
NDL SPNL 22GX7 QUINCKE BK (NEEDLE) IMPLANT
NEEDLE SPNL 22GX7 QUINCKE BK (NEEDLE) IMPLANT
NS IRRIG 1000ML POUR BTL (IV SOLUTION) ×2 IMPLANT
PACK BASIN DAY SURGERY FS (CUSTOM PROCEDURE TRAY) ×2 IMPLANT
PATTIES SURGICAL .5 X3 (DISPOSABLE) ×1 IMPLANT
SHEET MEDIUM DRAPE 40X70 STRL (DRAPES) ×2 IMPLANT
SLEEVE SCD COMPRESS KNEE MED (STOCKING) ×1 IMPLANT
SURGILUBE 2OZ TUBE FLIPTOP (MISCELLANEOUS) ×1 IMPLANT
SYR 5ML LL (SYRINGE) IMPLANT
SYR CONTROL 10ML LL (SYRINGE) IMPLANT
SYR GAUGE ASSEMBLY ALLIANCE II (MISCELLANEOUS) ×1 IMPLANT
SYR TB 1ML LL NO SAFETY (SYRINGE) IMPLANT
TOWEL GREEN STERILE FF (TOWEL DISPOSABLE) ×2 IMPLANT
TUBE CONNECTING 20X1/4 (TUBING) ×3 IMPLANT

## 2021-07-06 NOTE — Anesthesia Procedure Notes (Signed)
Procedure Name: Intubation ?Date/Time: 07/06/2021 11:46 AM ?Performed by: Maryella Shivers, CRNA ?Pre-anesthesia Checklist: Patient identified, Emergency Drugs available, Suction available and Patient being monitored ?Patient Re-evaluated:Patient Re-evaluated prior to induction ?Oxygen Delivery Method: Circle system utilized ?Preoxygenation: Pre-oxygenation with 100% oxygen ?Induction Type: IV induction ?Ventilation: Mask ventilation without difficulty ?Laryngoscope Size: Mac and 3 ?Grade View: Grade II ?Tube type: Oral ?Tube size: 4.5 mm ?Number of attempts: 1 ?Airway Equipment and Method: Stylet and Oral airway ?Placement Confirmation: ETT inserted through vocal cords under direct vision, positive ETCO2 and breath sounds checked- equal and bilateral ?Secured at: 20 cm ?Tube secured with: Tape ?Dental Injury: Teeth and Oropharynx as per pre-operative assessment  ? ? ? ? ?

## 2021-07-06 NOTE — Anesthesia Postprocedure Evaluation (Signed)
Anesthesia Post Note ? ?Patient: Victoria Mccormick ? ?Procedure(s) Performed: MICROLARYNGOSCOPY WITH LASER AND BALLOON DILATION ? ?  ? ?Patient location during evaluation: PACU ?Anesthesia Type: General ?Level of consciousness: awake and alert ?Pain management: pain level controlled ?Vital Signs Assessment: post-procedure vital signs reviewed and stable ?Respiratory status: spontaneous breathing, nonlabored ventilation and respiratory function stable ?Cardiovascular status: blood pressure returned to baseline and stable ?Postop Assessment: no apparent nausea or vomiting ?Anesthetic complications: no ? ? ?No notable events documented. ? ?Last Vitals:  ?Vitals:  ? 07/06/21 1245 07/06/21 1312  ?BP: (!) 156/75 (!) 186/76  ?Pulse: (!) 56 61  ?Resp: 18 16  ?Temp:  (!) 36.3 ?C  ?SpO2: 96% 95%  ?  ?Last Pain:  ?Vitals:  ? 07/06/21 1312  ?TempSrc: Oral  ?PainSc: 1   ? ? ?  ?  ?  ?  ?  ?  ? ?Lynda Rainwater ? ? ? ? ?

## 2021-07-06 NOTE — Anesthesia Preprocedure Evaluation (Signed)
Anesthesia Evaluation  ?Patient identified by MRN, date of birth, ID band ?Patient awake ? ? ? ?Reviewed: ?Allergy & Precautions, NPO status , Patient's Chart, lab work & pertinent test results, reviewed documented beta blocker date and time  ? ?Airway ?Mallampati: IV ? ?TM Distance: >3 FB ?Neck ROM: Full ? ? ? Dental ?no notable dental hx. ? ?  ?Pulmonary ?former smoker,  ?  ?Pulmonary exam normal ?breath sounds clear to auscultation ? ? ? ? ? ? Cardiovascular ?hypertension, Pt. on medications and Pt. on home beta blockers ?Normal cardiovascular exam ?Rhythm:Regular Rate:Normal ? ? ?  ?Neuro/Psych ?negative neurological ROS ? negative psych ROS  ? GI/Hepatic ?negative GI ROS, Neg liver ROS,   ?Endo/Other  ?diabetes, Poorly Controlled, Oral Hypoglycemic Agents ? Renal/GU ?negative Renal ROS  ? ?  ?Musculoskeletal ? ?(+) Arthritis , Osteoarthritis,   ? Abdominal ?  ?Peds ? Hematology ?negative hematology ROS ?(+)   ?Anesthesia Other Findings ?SUBGLOTIC STENOSIS ? Reproductive/Obstetrics ? ?  ? ? ? ? ? ? ? ? ? ? ? ? ? ?  ?  ? ? ? ? ? ? ? ? ?Anesthesia Physical ? ?Anesthesia Plan ? ?ASA: III ? ?Anesthesia Plan: General  ? ?Post-op Pain Management:   ? ?Induction: Intravenous ? ?PONV Risk Score and Plan: 3 and Ondansetron, Dexamethasone, Midazolam and Treatment may vary due to age or medical condition ? ?Airway Management Planned: Oral ETT ? ?Additional Equipment:  ? ?Intra-op Plan:  ? ?Post-operative Plan: Extubation in OR ? ?Informed Consent: I have reviewed the patients History and Physical, chart, labs and discussed the procedure including the risks, benefits and alternatives for the proposed anesthesia with the patient or authorized representative who has indicated his/her understanding and acceptance.  ? ? ? ?Dental advisory given ? ?Plan Discussed with: CRNA ? ?Anesthesia Plan Comments:   ? ? ? ? ? ? ?Anesthesia Quick Evaluation ? ?

## 2021-07-06 NOTE — H&P (Signed)
?Chief complaint: Subglottic stenosis. ? ?HPI: Longstanding history of idiopathic subglottic stenosis, has had multiple laser and dilation procedures. The last 1 was last June. She typically needs this twice each year but this year has done especially well. In the past week she has started to notice that she is a little bit more noisy than she had been. She has 2 vacations planned in June and would like to make sure that if she needs something done prior to the vacations that she can get that done. ? ?PMH/Meds/All/SocHx/FamHx/ROS:  ? ?Past Medical History:  ?Diagnosis Date  ? Diabetes mellitus ([place])  ? High cholesterol  ? Hypertension  ? Subglottic stenosis  ? ?Past Surgical History:  ?Procedure Laterality Date  ? LARYNGEAL DILATION  ? ?No family history of bleeding disorders, wound healing problems or difficulty with anesthesia.  ? ?Social History  ? ?Socioeconomic History  ? Marital status: Widowed  ?Spouse name: Not on file  ? Number of children: Not on file  ? Years of education: Not on file  ? Highest education level: Not on file  ?Occupational History  ? Not on file  ?Tobacco Use  ? Smoking status: Former  ? Smokeless tobacco: Never  ?Substance and Sexual Activity  ? Alcohol use: No  ? Drug use: No  ? Sexual activity: Not on file  ?Other Topics Concern  ? Not on file  ?Social History Narrative  ? Not on file  ? ?Social Determinants of Health  ? ?Financial Resource Strain: Not on file  ?Food Insecurity: Not on file  ?Transportation Needs: Not on file  ?Physical Activity: Not on file  ?Stress: Not on file  ?Social Connections: Not on file  ?Housing Stability: Not on file  ? ?Current Outpatient Medications:  ? glimepiride (AMARYL) 1 MG tablet, glimepiride 1 mg tablet TAKE 1 TABLET BY MOUTH EVERY DAY WITH BREAKFAST OR THE FIRST MAIN MEAL OF THE DAY FOR DIABETES, Disp: , Rfl:  ? lisinopril (PRINIVIL,ZESTRIL) 20 MG tablet, TK 1 T PO QD FOR BP, Disp: , Rfl: 1 ? metFORMIN ER (GLUCOPHAGE-XR) 500 MG extended release  tablet, TK 2 TS PO BID WF FOR DIABETES, Disp: , Rfl: 1 ? metoPROLOL succinate (TOPROL-XL) 50 MG 24 hr tablet, TK 1 T PO QD FOR BP, Disp: , Rfl: 1 ? ONGLYZA 5 mg Tab, TK 1 T PO QD FOR DIABETES, Disp: , Rfl: 1 ? pantoprazole (PROTONIX) 40 MG tablet, pantoprazole 40 mg tablet,delayed release TAKE 1 TABLET BY MOUTH EVERY DAY, Disp: , Rfl:  ? simvastatin (ZOCOR) 40 MG tablet, TK 1 T PO QD FOR CHOLESTEROL, Disp: , Rfl: 1 ? SITagliptin phosphate (JANUVIA) 100 MG tablet, Januvia 100 mg tablet TAKE 1 TABLET BY MOUTH EVERY DAY FOR DIABETES, Disp: , Rfl:  ? triamterene-hydrochlorothiazide (MAXZIDE-25) 37.5-25 mg per tablet, TK 1 T PO QD FOR BP, Disp: , Rfl: 1 ? valsartan (DIOVAN) 320 MG tablet, valsartan 320 mg tablet TAKE 1 TABLET BY MOUTH EVERY DAY FOR BLOOD PRESSURE, Disp: , Rfl:  ? esomeprazole (NEXIUM) 40 MG capsule, TK 1 C PO QD, Disp: , Rfl: 1 ? ?A complete ROS was performed with pertinent positives/negatives noted in the HPI. The remainder of the ROS are negative. ? ? ?Physical Exam:  ? ?Temp (!) 96.7 ?F (35.9 ?C)  Ht 1.702 m ('5\' 7"'$ )  Wt 81.6 kg (180 lb)  BMI 28.19 kg/m?  ? ?General: Healthy and alert, in no distress, breathing easily, with very slight inspiratory noisy breathing. Normal affect. In a pleasant  mood. ?Head: Normocephalic, atraumatic. No masses, or scars. ?Eyes: Pupils are equal, and reactive to light. Vision is grossly intact. No spontaneous or gaze nystagmus. ?Ears: External ears are healthy. ?Hearing: Grossly normal. ?Nose: Nasal cavities are clear with healthy mucosa, no polyps or exudate. Airways are patent. ?Face: No masses or scars, facial nerve function is symmetric. ?Oral Cavity: No mucosal abnormalities are noted. Tongue with normal mobility. Dentition appears healthy. ?Oropharynx: Tonsils are symmetric. There are no mucosal masses identified. Tongue base appears normal and healthy. ?Larynx/Hypopharynx: indirect exam reveals healthy, mobile vocal cords, without mucosal lesions in the  hypopharynx or larynx. In the subglottis there appears to be a shelf seen centered anteriorly but minimally obstructing. ?Chest: Deferred ?Neck: No palpable masses, no cervical adenopathy, no thyroid nodules or enlargement. ?Neuro: Cranial nerves II-XII with normal function. ?Balance: Normal gate. ?Other findings: none. ? ?Independent Review of Additional Tests or Records:  ?none ? ?Procedures:  ?none ? 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 I am going to recommend that we pencil her into the schedule in early June in case she needs something done. She can certainly contact us prior to that if it gets much worse. We will hold off on prednisone due to her diabetes. She understands and agrees.  ?

## 2021-07-06 NOTE — Op Note (Signed)
OPERATIVE REPORT ?  ?DATE OF SURGERY: 07/06/2020 ?  ?PATIENT:  Victoria Mccormick,  71 y.o. female ?  ?PRE-OPERATIVE DIAGNOSIS:  Subglottic Stenosis ?  ?POST-OPERATIVE DIAGNOSIS:  Subglottic Stenosis ?  ?PROCEDURE:  Procedure(s): ?Microlaryngoscopy with Laser and Balloon Dilation ?  ?SURGEON:  Beckie Salts, MD ?  ?ASSISTANTS: None ?  ?ANESTHESIA:   General  ?  ?EBL: Less than 10 ml ?  ?DRAINS: None ?  ?LOCAL MEDICATIONS USED:  None ?  ?SPECIMEN:  none ?  ?COUNTS:  Correct ?  ?PROCEDURE DETAILS: ?The patient was taken to the operating room and placed on the operating table in the supine position. Following induction of general endotracheal anesthesia, using a 4.5 endotracheal tube, the table was turned 90 degrees and the patient was draped for standard operative endoscopy including saline soaked eye pads and saline soaked towels around the face.  A maxillary tooth protector was used throughout the case.  A Jako laryngoscope was entered into the oral cavity used to evaluate the larynx and attached to the Mayo stand with the suspension apparatus.  The endotracheal tube was removed and the airway was inspected.  The tube was then replaced.  Throughout the case the tube was removed and replaced as needed following the direction of anesthesia. ? ?Using the operating microscope the subglottic area was inspected.  There was a ring of circumferential stenotic tissue that was then lasered using the CO2 laser attachment on the microscope at a setting of 4 W continuous power.  Radial cuts were created at 12:00 10:00 8:00 and 6:00.  On the right side there was minimal stenosis.  The CRE balloon dilator was then used.  This was inflated up to 4.5 atm was corresponded to 16.5 mm diameter.  This was performed on 2 occasions making sure that the balloon was in the correct position.    At the termination procedure a 7.0 endotracheal tube was placed.  Care with the patient was then taken over by anesthesia.  The endoscope was removed.   Patient was awakened extubated and transferred to recovery in stable condition. ?  ?  ?  ?PATIENT DISPOSITION:  To PACU, stable ?

## 2021-07-06 NOTE — Discharge Instructions (Signed)

## 2021-07-06 NOTE — Transfer of Care (Signed)
Immediate Anesthesia Transfer of Care Note ? ?Patient: Victoria Mccormick ? ?Procedure(s) Performed: MICROLARYNGOSCOPY WITH LASER AND BALLOON DILATION ? ?Patient Location: PACU ? ?Anesthesia Type:General ? ?Level of Consciousness: awake, alert  and oriented ? ?Airway & Oxygen Therapy: Patient Spontanous Breathing and Patient connected to face mask oxygen ? ?Post-op Assessment: Report given to RN and Post -op Vital signs reviewed and stable ? ?Post vital signs: Reviewed and stable ? ?Last Vitals:  ?Vitals Value Taken Time  ?BP 152/70 07/06/21 1233  ?Temp    ?Pulse 61 07/06/21 1236  ?Resp 16 07/06/21 1236  ?SpO2 100 % 07/06/21 1236  ?Vitals shown include unvalidated device data. ? ?Last Pain:  ?Vitals:  ? 07/06/21 1000  ?TempSrc: Oral  ?PainSc: 0-No pain  ?   ? ?  ? ?Complications: No notable events documented. ?

## 2021-07-07 ENCOUNTER — Encounter (HOSPITAL_BASED_OUTPATIENT_CLINIC_OR_DEPARTMENT_OTHER): Payer: Self-pay | Admitting: Otolaryngology

## 2021-07-26 DIAGNOSIS — H5201 Hypermetropia, right eye: Secondary | ICD-10-CM | POA: Diagnosis not present

## 2021-07-26 DIAGNOSIS — E119 Type 2 diabetes mellitus without complications: Secondary | ICD-10-CM | POA: Diagnosis not present

## 2021-07-27 DIAGNOSIS — E7800 Pure hypercholesterolemia, unspecified: Secondary | ICD-10-CM | POA: Diagnosis not present

## 2021-07-27 DIAGNOSIS — D692 Other nonthrombocytopenic purpura: Secondary | ICD-10-CM | POA: Diagnosis not present

## 2021-07-27 DIAGNOSIS — N183 Chronic kidney disease, stage 3 unspecified: Secondary | ICD-10-CM | POA: Diagnosis not present

## 2021-07-27 DIAGNOSIS — Z1211 Encounter for screening for malignant neoplasm of colon: Secondary | ICD-10-CM | POA: Diagnosis not present

## 2021-07-27 DIAGNOSIS — Z0000 Encounter for general adult medical examination without abnormal findings: Secondary | ICD-10-CM | POA: Diagnosis not present

## 2021-07-27 DIAGNOSIS — E1129 Type 2 diabetes mellitus with other diabetic kidney complication: Secondary | ICD-10-CM | POA: Diagnosis not present

## 2021-07-27 DIAGNOSIS — I129 Hypertensive chronic kidney disease with stage 1 through stage 4 chronic kidney disease, or unspecified chronic kidney disease: Secondary | ICD-10-CM | POA: Diagnosis not present

## 2021-07-27 DIAGNOSIS — K219 Gastro-esophageal reflux disease without esophagitis: Secondary | ICD-10-CM | POA: Diagnosis not present

## 2021-08-02 DIAGNOSIS — Z1211 Encounter for screening for malignant neoplasm of colon: Secondary | ICD-10-CM | POA: Diagnosis not present

## 2021-08-29 DIAGNOSIS — M170 Bilateral primary osteoarthritis of knee: Secondary | ICD-10-CM | POA: Diagnosis not present

## 2021-09-29 ENCOUNTER — Other Ambulatory Visit: Payer: Self-pay | Admitting: Physician Assistant

## 2021-09-29 DIAGNOSIS — R195 Other fecal abnormalities: Secondary | ICD-10-CM | POA: Diagnosis not present

## 2021-10-19 DIAGNOSIS — J398 Other specified diseases of upper respiratory tract: Secondary | ICD-10-CM | POA: Diagnosis not present

## 2021-10-19 DIAGNOSIS — R195 Other fecal abnormalities: Secondary | ICD-10-CM | POA: Diagnosis not present

## 2021-10-28 DIAGNOSIS — E1169 Type 2 diabetes mellitus with other specified complication: Secondary | ICD-10-CM | POA: Diagnosis not present

## 2021-11-07 DIAGNOSIS — Z1212 Encounter for screening for malignant neoplasm of rectum: Secondary | ICD-10-CM | POA: Diagnosis not present

## 2021-11-07 DIAGNOSIS — Z1211 Encounter for screening for malignant neoplasm of colon: Secondary | ICD-10-CM | POA: Diagnosis not present

## 2021-11-28 DIAGNOSIS — D122 Benign neoplasm of ascending colon: Secondary | ICD-10-CM | POA: Diagnosis not present

## 2021-11-28 DIAGNOSIS — D123 Benign neoplasm of transverse colon: Secondary | ICD-10-CM | POA: Diagnosis not present

## 2021-11-28 DIAGNOSIS — D127 Benign neoplasm of rectosigmoid junction: Secondary | ICD-10-CM | POA: Diagnosis not present

## 2021-11-28 DIAGNOSIS — R195 Other fecal abnormalities: Secondary | ICD-10-CM | POA: Diagnosis not present

## 2021-11-28 DIAGNOSIS — K5730 Diverticulosis of large intestine without perforation or abscess without bleeding: Secondary | ICD-10-CM | POA: Diagnosis not present

## 2021-11-28 DIAGNOSIS — K648 Other hemorrhoids: Secondary | ICD-10-CM | POA: Diagnosis not present

## 2021-11-28 DIAGNOSIS — D124 Benign neoplasm of descending colon: Secondary | ICD-10-CM | POA: Diagnosis not present

## 2021-11-30 DIAGNOSIS — D124 Benign neoplasm of descending colon: Secondary | ICD-10-CM | POA: Diagnosis not present

## 2021-11-30 DIAGNOSIS — D127 Benign neoplasm of rectosigmoid junction: Secondary | ICD-10-CM | POA: Diagnosis not present

## 2021-12-30 DIAGNOSIS — M25561 Pain in right knee: Secondary | ICD-10-CM | POA: Diagnosis not present

## 2021-12-30 DIAGNOSIS — M170 Bilateral primary osteoarthritis of knee: Secondary | ICD-10-CM | POA: Diagnosis not present

## 2021-12-30 DIAGNOSIS — M25562 Pain in left knee: Secondary | ICD-10-CM | POA: Diagnosis not present

## 2022-01-26 DIAGNOSIS — I10 Essential (primary) hypertension: Secondary | ICD-10-CM | POA: Diagnosis not present

## 2022-01-26 DIAGNOSIS — N183 Chronic kidney disease, stage 3 unspecified: Secondary | ICD-10-CM | POA: Diagnosis not present

## 2022-01-26 DIAGNOSIS — E7800 Pure hypercholesterolemia, unspecified: Secondary | ICD-10-CM | POA: Diagnosis not present

## 2022-01-26 DIAGNOSIS — E1169 Type 2 diabetes mellitus with other specified complication: Secondary | ICD-10-CM | POA: Diagnosis not present

## 2022-01-26 DIAGNOSIS — E1129 Type 2 diabetes mellitus with other diabetic kidney complication: Secondary | ICD-10-CM | POA: Diagnosis not present

## 2022-01-26 DIAGNOSIS — K219 Gastro-esophageal reflux disease without esophagitis: Secondary | ICD-10-CM | POA: Diagnosis not present

## 2022-01-26 DIAGNOSIS — Z86010 Personal history of colonic polyps: Secondary | ICD-10-CM | POA: Diagnosis not present

## 2022-02-01 DIAGNOSIS — E1169 Type 2 diabetes mellitus with other specified complication: Secondary | ICD-10-CM | POA: Diagnosis not present

## 2022-02-01 DIAGNOSIS — I10 Essential (primary) hypertension: Secondary | ICD-10-CM | POA: Diagnosis not present

## 2022-02-01 DIAGNOSIS — E7800 Pure hypercholesterolemia, unspecified: Secondary | ICD-10-CM | POA: Diagnosis not present

## 2022-03-10 DIAGNOSIS — E119 Type 2 diabetes mellitus without complications: Secondary | ICD-10-CM | POA: Diagnosis not present

## 2022-03-10 DIAGNOSIS — I10 Essential (primary) hypertension: Secondary | ICD-10-CM | POA: Diagnosis not present

## 2022-03-10 DIAGNOSIS — E7800 Pure hypercholesterolemia, unspecified: Secondary | ICD-10-CM | POA: Diagnosis not present

## 2022-03-10 DIAGNOSIS — N182 Chronic kidney disease, stage 2 (mild): Secondary | ICD-10-CM | POA: Diagnosis not present

## 2022-04-11 ENCOUNTER — Other Ambulatory Visit: Payer: Self-pay | Admitting: Family Medicine

## 2022-04-11 DIAGNOSIS — Z1231 Encounter for screening mammogram for malignant neoplasm of breast: Secondary | ICD-10-CM

## 2022-04-19 DIAGNOSIS — M170 Bilateral primary osteoarthritis of knee: Secondary | ICD-10-CM | POA: Diagnosis not present

## 2022-04-28 DIAGNOSIS — J386 Stenosis of larynx: Secondary | ICD-10-CM | POA: Diagnosis not present

## 2022-05-30 DIAGNOSIS — M533 Sacrococcygeal disorders, not elsewhere classified: Secondary | ICD-10-CM | POA: Diagnosis not present

## 2022-06-02 ENCOUNTER — Ambulatory Visit
Admission: RE | Admit: 2022-06-02 | Discharge: 2022-06-02 | Disposition: A | Discharge status: Home / Self Care | Payer: Medicare Other | Source: Ambulatory Visit | Attending: Family Medicine | Admitting: Family Medicine

## 2022-06-02 DIAGNOSIS — Z1231 Encounter for screening mammogram for malignant neoplasm of breast: Secondary | ICD-10-CM | POA: Diagnosis not present

## 2022-07-05 ENCOUNTER — Encounter (HOSPITAL_BASED_OUTPATIENT_CLINIC_OR_DEPARTMENT_OTHER): Payer: Self-pay | Admitting: Otolaryngology

## 2022-07-06 ENCOUNTER — Encounter (HOSPITAL_BASED_OUTPATIENT_CLINIC_OR_DEPARTMENT_OTHER)
Admission: RE | Admit: 2022-07-06 | Discharge: 2022-07-06 | Disposition: A | Discharge status: Home / Self Care | Payer: Medicare Other | Source: Ambulatory Visit | Attending: Otolaryngology | Admitting: Otolaryngology

## 2022-07-06 DIAGNOSIS — Z01818 Encounter for other preprocedural examination: Secondary | ICD-10-CM | POA: Insufficient documentation

## 2022-07-06 LAB — BASIC METABOLIC PANEL
Anion gap: 7 (ref 5–15)
BUN: 20 mg/dL (ref 8–23)
CO2: 29 mmol/L (ref 22–32)
Calcium: 9 mg/dL (ref 8.9–10.3)
Chloride: 101 mmol/L (ref 98–111)
Creatinine, Ser: 0.96 mg/dL (ref 0.44–1.00)
GFR, Estimated: >60 mL/min (ref >60)
Glucose, Bld: 104 mg/dL — ABNORMAL HIGH (ref 70–99)
Potassium: 3.9 mmol/L (ref 3.5–5.1)
Sodium: 137 mmol/L (ref 135–145)

## 2022-07-11 NOTE — Anesthesia Preprocedure Evaluation (Addendum)
Anesthesia Evaluation  Patient identified by MRN, date of birth, ID band Patient awake    Reviewed: Allergy & Precautions, H&P , NPO status , Patient's Chart, lab work & pertinent test results, reviewed documented beta blocker date and time   History of Anesthesia Complications (+) history of anesthetic complications  Airway Mallampati: III  TM Distance: >3 FB Neck ROM: Full   Comment: Previous airway Preoxygenation: Pre-oxygenation with 100% oxygen Induction Type: IV induction Ventilation: Mask ventilation without difficulty Laryngoscope Size: Mac and 3 Grade View: Grade II Tube type: Oral Tube size: 4.5 mm Number of attempts: 1 Airway Equipment and Method: Stylet and Oral airway Placement Confirmation: ETT inserted through vocal cords under direct vision, positive ETCO2 and breath sounds checked- equal and bilateral Secured at: 20 cm Tube secured with: Tape Dental Injury: Teeth and Oropharynx as per pre-operative assessment   Dental no notable dental hx. (+) Teeth Intact, Dental Advisory Given   Pulmonary neg pulmonary ROS, former smoker   Pulmonary exam normal breath sounds clear to auscultation       Cardiovascular hypertension, Pt. on medications and Pt. on home beta blockers negative cardio ROS Normal cardiovascular exam Rhythm:Regular Rate:Normal     Neuro/Psych negative neurological ROS  negative psych ROS   GI/Hepatic negative GI ROS, Neg liver ROS,,,  Endo/Other  negative endocrine ROSdiabetes, Poorly Controlled, Oral Hypoglycemic Agents    Renal/GU negative Renal ROS  negative genitourinary   Musculoskeletal negative musculoskeletal ROS (+) Arthritis , Osteoarthritis,    Abdominal   Peds negative pediatric ROS (+)  Hematology negative hematology ROS (+)   Anesthesia Other Findings SUBGLOTIC STENOSIS  Reproductive/Obstetrics negative OB ROS                              Anesthesia Physical Anesthesia Plan  ASA: 3  Anesthesia Plan: General   Post-op Pain Management: Minimal or no pain anticipated   Induction: Intravenous  PONV Risk Score and Plan: 3 and Ondansetron, Dexamethasone, Midazolam and Treatment may vary due to age or medical condition  Airway Management Planned: Oral ETT  Additional Equipment: None  Intra-op Plan:   Post-operative Plan: Extubation in OR  Informed Consent: I have reviewed the patients History and Physical, chart, labs and discussed the procedure including the risks, benefits and alternatives for the proposed anesthesia with the patient or authorized representative who has indicated his/her understanding and acceptance.     Dental advisory given  Plan Discussed with: CRNA and Anesthesiologist  Anesthesia Plan Comments:         Anesthesia Quick Evaluation

## 2022-07-12 ENCOUNTER — Ambulatory Visit (HOSPITAL_BASED_OUTPATIENT_CLINIC_OR_DEPARTMENT_OTHER): Payer: Medicare Other | Admitting: Certified Registered"

## 2022-07-12 ENCOUNTER — Encounter (HOSPITAL_BASED_OUTPATIENT_CLINIC_OR_DEPARTMENT_OTHER): Payer: Self-pay | Admitting: Otolaryngology

## 2022-07-12 ENCOUNTER — Ambulatory Visit (HOSPITAL_BASED_OUTPATIENT_CLINIC_OR_DEPARTMENT_OTHER)
Admission: RE | Admit: 2022-07-12 | Discharge: 2022-07-12 | Disposition: A | Discharge status: Home / Self Care | Payer: Medicare Other | Attending: Otolaryngology | Admitting: Otolaryngology

## 2022-07-12 ENCOUNTER — Encounter (HOSPITAL_BASED_OUTPATIENT_CLINIC_OR_DEPARTMENT_OTHER)
Admission: RE | Disposition: A | Discharge status: Home / Self Care | Payer: Self-pay | Source: Home / Self Care | Attending: Otolaryngology

## 2022-07-12 ENCOUNTER — Other Ambulatory Visit: Payer: Self-pay

## 2022-07-12 DIAGNOSIS — I10 Essential (primary) hypertension: Secondary | ICD-10-CM | POA: Diagnosis not present

## 2022-07-12 DIAGNOSIS — Z87891 Personal history of nicotine dependence: Secondary | ICD-10-CM

## 2022-07-12 DIAGNOSIS — J386 Stenosis of larynx: Secondary | ICD-10-CM | POA: Insufficient documentation

## 2022-07-12 DIAGNOSIS — E1165 Type 2 diabetes mellitus with hyperglycemia: Secondary | ICD-10-CM

## 2022-07-12 DIAGNOSIS — Z7984 Long term (current) use of oral hypoglycemic drugs: Secondary | ICD-10-CM | POA: Diagnosis not present

## 2022-07-12 DIAGNOSIS — Z01818 Encounter for other preprocedural examination: Secondary | ICD-10-CM

## 2022-07-12 HISTORY — PX: MICROLARYNGOSCOPY WITH LASER AND BALLOON DILATION: SHX5973

## 2022-07-12 LAB — GLUCOSE, CAPILLARY
Glucose-Capillary: 99 mg/dL (ref 70–99)
Glucose-Capillary: 120 mg/dL — ABNORMAL HIGH (ref 70–99)

## 2022-07-12 SURGERY — MICROLARYNGOSCOPY WITH LASER AND BALLOON DILATION
Anesthesia: General | Site: Throat

## 2022-07-12 MED ORDER — ACETAMINOPHEN 325 MG PO TABS: 325.0000 mg | ORAL_TABLET | ORAL | Status: DC | PRN

## 2022-07-12 MED ORDER — CEPHALEXIN 500 MG PO CAPS: 500.0000 mg | ORAL_CAPSULE | Freq: Three times a day (TID) | ORAL | 0 refills | Status: DC

## 2022-07-12 MED ORDER — LACTATED RINGERS IV SOLN: INTRAVENOUS | Status: DC

## 2022-07-12 MED ORDER — SUGAMMADEX SODIUM 200 MG/2ML IV SOLN
INTRAVENOUS | Status: DC | PRN
  Administered 2022-07-12: 200 mg via INTRAVENOUS

## 2022-07-12 MED ORDER — ONDANSETRON HCL 4 MG/2ML IJ SOLN
INTRAMUSCULAR | Status: DC | PRN
  Administered 2022-07-12: 4 mg via INTRAVENOUS

## 2022-07-12 MED ORDER — PROPOFOL 10 MG/ML IV BOLUS
INTRAVENOUS | Status: DC | PRN
  Administered 2022-07-12: 200 mg via INTRAVENOUS

## 2022-07-12 MED ORDER — ROCURONIUM BROMIDE 100 MG/10ML IV SOLN
INTRAVENOUS | Status: DC | PRN
  Administered 2022-07-12: 60 mg via INTRAVENOUS

## 2022-07-12 MED ORDER — LIDOCAINE HCL (CARDIAC) PF 100 MG/5ML IV SOSY
PREFILLED_SYRINGE | INTRAVENOUS | Status: DC | PRN
  Administered 2022-07-12: 60 mg via INTRAVENOUS

## 2022-07-12 MED ORDER — MEPERIDINE HCL 25 MG/ML IJ SOLN: 6.2500 mg | INTRAMUSCULAR | Status: DC | PRN

## 2022-07-12 MED ORDER — LIDOCAINE-EPINEPHRINE 1 %-1:100000 IJ SOLN
INTRAMUSCULAR | Status: AC
  Filled 2022-07-12: qty 2

## 2022-07-12 MED ORDER — OXYCODONE HCL 5 MG PO TABS: 5.0000 mg | ORAL_TABLET | Freq: Once | ORAL | Status: DC | PRN

## 2022-07-12 MED ORDER — ONDANSETRON HCL 4 MG/2ML IJ SOLN: 4.0000 mg | Freq: Once | INTRAMUSCULAR | Status: DC | PRN

## 2022-07-12 MED ORDER — FENTANYL CITRATE (PF) 100 MCG/2ML IJ SOLN: 25.0000 ug | INTRAMUSCULAR | Status: DC | PRN

## 2022-07-12 MED ORDER — FENTANYL CITRATE (PF) 100 MCG/2ML IJ SOLN
INTRAMUSCULAR | Status: DC | PRN
  Administered 2022-07-12: 50 ug via INTRAVENOUS

## 2022-07-12 MED ORDER — DEXAMETHASONE SODIUM PHOSPHATE 4 MG/ML IJ SOLN
INTRAMUSCULAR | Status: DC | PRN
  Administered 2022-07-12: 4 mg via INTRAVENOUS

## 2022-07-12 MED ORDER — ACETAMINOPHEN 160 MG/5ML PO SOLN: 325.0000 mg | ORAL | Status: DC | PRN

## 2022-07-12 MED ORDER — OXYCODONE HCL 5 MG/5ML PO SOLN: 5.0000 mg | Freq: Once | ORAL | Status: DC | PRN

## 2022-07-12 MED ORDER — FENTANYL CITRATE (PF) 100 MCG/2ML IJ SOLN
INTRAMUSCULAR | Status: AC
  Filled 2022-07-12: qty 2

## 2022-07-12 MED ORDER — EPINEPHRINE PF 1 MG/ML IJ SOLN
INTRAMUSCULAR | Status: AC
  Filled 2022-07-12: qty 1

## 2022-07-12 SURGICAL SUPPLY — 33 items
BALLN PULM 12 13.5 15X75 (BALLOONS)
BALLN PULM 15 16.5 18X75 (BALLOONS) ×1
BALLN PULMONARY 10-12 (MISCELLANEOUS) IMPLANT
BALLN PULMONARY 8-10 OD75 (MISCELLANEOUS) IMPLANT
BALLOON PULM 12 13.5 15X75 (BALLOONS) IMPLANT
BALLOON PULM 15 16.5 18X75 (BALLOONS) IMPLANT
BNDG EYE OVAL 2 1/8 X 2 5/8 (GAUZE/BANDAGES/DRESSINGS) ×2 IMPLANT
CANISTER SUCT 1200ML W/VALVE (MISCELLANEOUS) ×1 IMPLANT
DEFOGGER MIRROR 1QT (MISCELLANEOUS) IMPLANT
DEPRESSOR TONGUE 6 IN STERILE (GAUZE/BANDAGES/DRESSINGS) ×1 IMPLANT
GAUZE SPONGE 4X4 12PLY STRL LF (GAUZE/BANDAGES/DRESSINGS) ×2 IMPLANT
GLOVE BIOGEL PI IND STRL 7.0 (GLOVE) IMPLANT
GLOVE ECLIPSE 7.5 STRL STRAW (GLOVE) ×1 IMPLANT
GOWN STRL REUS W/ TWL LRG LVL3 (GOWN DISPOSABLE) IMPLANT
GOWN STRL REUS W/ TWL XL LVL3 (GOWN DISPOSABLE) IMPLANT
GOWN STRL REUS W/TWL LRG LVL3 (GOWN DISPOSABLE)
GOWN STRL REUS W/TWL XL LVL3 (GOWN DISPOSABLE) ×1
GUARD TEETH (MISCELLANEOUS) IMPLANT
MARKER SKIN DUAL TIP RULER LAB (MISCELLANEOUS) IMPLANT
NDL SPNL 22GX7 QUINCKE BK (NEEDLE) IMPLANT
NEEDLE SPNL 22GX7 QUINCKE BK (NEEDLE) IMPLANT
NS IRRIG 1000ML POUR BTL (IV SOLUTION) ×1 IMPLANT
PACK BASIN DAY SURGERY FS (CUSTOM PROCEDURE TRAY) ×1 IMPLANT
PATTIES SURGICAL .5 X3 (DISPOSABLE) IMPLANT
SHEET MEDIUM DRAPE 40X70 STRL (DRAPES) ×1 IMPLANT
SLEEVE SCD COMPRESS KNEE MED (STOCKING) IMPLANT
SURGILUBE 2OZ TUBE FLIPTOP (MISCELLANEOUS) IMPLANT
SYR 5ML LL (SYRINGE) IMPLANT
SYR CONTROL 10ML LL (SYRINGE) IMPLANT
SYR GAUGE ASSEMBLY ALLIANCE II (MISCELLANEOUS) IMPLANT
SYR TB 1ML LL NO SAFETY (SYRINGE) IMPLANT
TOWEL GREEN STERILE FF (TOWEL DISPOSABLE) ×1 IMPLANT
TUBE CONNECTING 20X1/4 (TUBING) ×1 IMPLANT

## 2022-07-12 NOTE — Anesthesia Postprocedure Evaluation (Signed)
Anesthesia Post Note  Patient: Victoria Mccormick  Procedure(s) Performed: MICROLARYNGOSCOPY WITH LASER AND BALLOON DILATION (Throat)     Patient location during evaluation: PACU Anesthesia Type: General Level of consciousness: awake and alert Pain management: pain level controlled Vital Signs Assessment: post-procedure vital signs reviewed and stable Respiratory status: spontaneous breathing, nonlabored ventilation, respiratory function stable and patient connected to nasal cannula oxygen Cardiovascular status: blood pressure returned to baseline and stable Postop Assessment: no apparent nausea or vomiting Anesthetic complications: yes   Encounter Notable Events  Notable Event Outcome Phase Comment  Difficult to intubate - expected  Intraprocedure Filed from anesthesia note documentation.    Last Vitals:  Vitals:   07/12/22 0850 07/12/22 0907  BP:  (!) 158/68  Pulse: 60 62  Resp: 16 16  Temp: (!) 36.1 C (!) 36.1 C  SpO2: 97% 98%    Last Pain:  Vitals:   07/12/22 0907  TempSrc:   PainSc: 0-No pain                 Modesto Ganoe

## 2022-07-12 NOTE — H&P (Signed)
Complaint:Stridor HPI:Long history of subglottic stenosis, having worsening stridor and exercise intolerance.      Past Medical History:  Diagnosis Date  . Arthritis   . 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 Complication of anesthesia    generalized muscle weakness after anesthesia for many days  . Diabetes mellitus   . GERD (gastroesophageal reflux disease)   . Hyperlipemia   . Hypertension   . Subglottic stenosis   . Wears glasses    reading         Past Surgical History:  Procedure Laterality Date  . CESAREAN SECTION     x2  . DIRECT LARYNGOSCOPY     glottic stenosis dilated-multiple times-2003to -2013 #8  . KNEE ARTHROSCOPY Left 12/2015  . MICROLARYNGOSCOPY WITH DILATION N/A 09/01/2016   Procedure: MICROLARYNGOSCOPY WITH BALLOON DILATION; Surgeon: Lenell Lama, MD; Location: [facility]; Service: ENT; Laterality: N/A;  . MICROLARYNGOSCOPY WITH LASER AND BALLOON DILATION N/A 08/15/2012   Procedure: MICROLARYNGOSCOPY WITH LASER AND BALLOON DILATION; Surgeon: Lupie Sawa, MD; Location: [facility]; Service: ENT; Laterality: N/A; Tracheal Dilation  . MICROLARYNGOSCOPY WITH LASER AND BALLOON DILATION N/A 01/31/2013   Procedure: MICROLARYNGOSCOPY WITH LASER AND BALLOON DILATION; Surgeon: Koree Staheli, MD; Location: [facility]; Service: ENT; Laterality: N/A;  . MICROLARYNGOSCOPY WITH LASER AND BALLOON DILATION N/A 08/18/2013   Procedure: MICROLARYNGOSCOPY WITH LASER AND BALLOON DILATION; Surgeon: Doneisha Ivey, MD; Location: [facility]; Service: ENT; Laterality: N/A;  . MICROLARYNGOSCOPY WITH LASER AND BALLOON DILATION N/A 08/27/2014   Procedure: MICROLARYNGOSCOPY WITH LASER AND BALLOON DILATION; Surgeon: Sonnia Strong, MD; Location: [facility]; Service: ENT; Laterality: N/A;  . MICROLARYNGOSCOPY WITH LASER AND  BALLOON DILATION N/A 07/15/2015   Procedure: MICROLARYNGOSCOPY WITH LASER AND BALLOON DILATION; Surgeon: Strummer Canipe, MD; Location: [facility]; Service: ENT; Laterality: N/A;  . MICROLARYNGOSCOPY WITH LASER AND BALLOON DILATION N/A 01/28/2016   Procedure: MICROLARYNGOSCOPY WITH LASER AND BALLOON DILATION; Surgeon: Keyunna Coco, MD; Location: [facility]; Service: ENT; Laterality: N/A;  . MICROLARYNGOSCOPY WITH LASER AND BALLOON DILATION N/A 05/18/2017   Procedure: MICROLARYNGOSCOPY WITH LASER AND BALLOON DILATION; Surgeon: Kaja Jackowski, MD; Location: [facility]; Service: ENT; Laterality: N/A;  . MICROLARYNGOSCOPY WITH LASER AND BALLOON DILATION N/A 01/28/2018   Procedure: MICROLARYNGOSCOPY with laser balloon dilation; Surgeon: Sacha Radloff, MD; Location: [facility]; Service: ENT; Laterality: N/A;    No family history on file. Social History:reports that she has quit smoking. She has never used smokeless tobacco. She reports current alcohol use. She reports that she does not use drugs.  Allergies:No Known Allergies  No medications prior to admission.    Lab Results Last 48 Hours  No results found for this or any previous visit (from the past 48 hour(s)).   Imaging Results (Last 48 hours)  No results found.    ROS: otherwise negative  There were no vitals taken for this visit.  PHYSICAL EXAM: Overall appearance: Healthy appearing, inspiratory stridor present. Head: Normocephalic, atraumatic. Ears: External auditory canals are clear; tympanic membranes are intact and the middle ears are free of any effusion. Nose: External nose is healthy in appearance. Internal nasal exam free of any lesions or obstruction. Oral Cavity/pharynx: There are no mucosal lesions or masses identified. Hypopharynx/Larynx: no signs of any mucosal lesions or masses identified. Vocal cords move normally. Neuro: No  identifiable neurologic deficits. Neck: No palpable neck masses.  Studies Reviewed:   none    Assessment/Plan Revision microlaryngoscopy with laser excision and balloon dilation of subglottic stenosis   

## 2022-07-12 NOTE — Anesthesia Procedure Notes (Signed)
Procedure Name: Intubation Date/Time: 07/12/2022 7:44 AM  Performed by: Ysabella Babiarz, Jewel Baize, CRNAPre-anesthesia Checklist: Patient identified, Emergency Drugs available, Suction available and Patient being monitored Patient Re-evaluated:Patient Re-evaluated prior to induction Oxygen Delivery Method: Circle system utilized Preoxygenation: Pre-oxygenation with 100% oxygen Induction Type: IV induction Ventilation: Mask ventilation without difficulty Laryngoscope Size: Mac and 3 Grade View: Grade II Tube type: Oral Tube size: 4.5 mm Number of attempts: 2 Airway Equipment and Method: Stylet, Oral airway and Video-laryngoscopy Placement Confirmation: ETT inserted through vocal cords under direct vision, positive ETCO2 and breath sounds checked- equal and bilateral Tube secured with: Tape Dental Injury: Teeth and Oropharynx as per pre-operative assessment  Difficulty Due To: Difficulty was anticipated Comments: Unable to pass tube through stricture glide scope used with gentle passage of 4.5 ETT, teeth gums unchanged

## 2022-07-12 NOTE — Discharge Instructions (Signed)

## 2022-07-12 NOTE — Transfer of Care (Signed)
Immediate Anesthesia Transfer of Care Note  Patient: Victoria Mccormick  Procedure(s) Performed: MICROLARYNGOSCOPY WITH LASER AND BALLOON DILATION (Throat)  Patient Location: PACU  Anesthesia Type:General  Level of Consciousness: awake, alert , oriented, and patient cooperative  Airway & Oxygen Therapy: Patient Spontanous Breathing and Patient connected to face mask oxygen  Post-op Assessment: Report given to RN and Post -op Vital signs reviewed and stable  Post vital signs: Reviewed and stable  Last Vitals:  Vitals Value Taken Time  BP 138/84 07/12/22 0824  Temp    Pulse 67 07/12/22 0825  Resp 14 07/12/22 0825  SpO2 100 % 07/12/22 0825  Vitals shown include unvalidated device data.  Last Pain:  Vitals:   07/12/22 0638  TempSrc: Oral  PainSc: 0-No pain      Patients Stated Pain Goal: 3 (07/12/22 1610)  Complications:  Encounter Notable Events  Notable Event Outcome Phase Comment  Difficult to intubate - expected  Intraprocedure Filed from anesthesia note documentation.

## 2022-07-12 NOTE — Op Note (Signed)
  OPERATIVE REPORT   DATE OF SURGERY: 07/06/2020   PATIENT:  Victoria Mccormick,  72 y.o. female   PRE-OPERATIVE DIAGNOSIS:  Subglottic Stenosis   POST-OPERATIVE DIAGNOSIS:  Subglottic Stenosis   PROCEDURE:  Procedure(s): Microlaryngoscopy with Laser and Balloon Dilation   SURGEON:  Susy Frizzle, MD   ASSISTANTS: None   ANESTHESIA:   General    EBL: Less than 10 ml   DRAINS: None   LOCAL MEDICATIONS USED:  None   SPECIMEN:  none   COUNTS:  Correct   PROCEDURE DETAILS: The patient was taken to the operating room and placed on the operating table in the supine position. Following induction of general endotracheal anesthesia, using a 4.5 endotracheal tube, the table was turned 90 degrees and the patient was draped for standard operative endoscopy including saline soaked eye pads and saline soaked towels around the face.  A maxillary tooth protector was used throughout the case.  A Jako laryngoscope was entered into the oral cavity used to evaluate the larynx and attached to the Mayo stand with the suspension apparatus.  The endotracheal tube was removed and the airway was inspected.  The tube was then replaced.  Throughout the case the tube was removed and replaced as needed following the direction of anesthesia.  Using the operating microscope the subglottic area was inspected.  There was a ring of circumferential stenotic tissue that was then lasered using the CO2 laser attachment on the microscope at a setting of 2 W continuous power.  Radial cuts were created at 12:00 10:00 8:00 and 6:00.  On the right side there was minimal stenosis.  The CRE balloon dilator was then used.  This was inflated up to 4.5 atm was corresponded to 16.5 mm diameter.  This was performed on 2 occasions making sure that the balloon was in the correct position.    At the termination procedure a 7.5 endotracheal tube was placed.  Care with the patient was then taken over by anesthesia.  The endoscope was  removed.  Patient was awakened extubated and transferred to recovery in stable condition.       PATIENT DISPOSITION:  To PACU, stable

## 2022-07-13 ENCOUNTER — Encounter (HOSPITAL_BASED_OUTPATIENT_CLINIC_OR_DEPARTMENT_OTHER): Payer: Self-pay | Admitting: Otolaryngology

## 2022-08-10 DIAGNOSIS — K219 Gastro-esophageal reflux disease without esophagitis: Secondary | ICD-10-CM | POA: Diagnosis not present

## 2022-08-10 DIAGNOSIS — E1122 Type 2 diabetes mellitus with diabetic chronic kidney disease: Secondary | ICD-10-CM | POA: Diagnosis not present

## 2022-08-10 DIAGNOSIS — Z0000 Encounter for general adult medical examination without abnormal findings: Secondary | ICD-10-CM | POA: Diagnosis not present

## 2022-08-10 DIAGNOSIS — Z86010 Personal history of colonic polyps: Secondary | ICD-10-CM | POA: Diagnosis not present

## 2022-08-10 DIAGNOSIS — N183 Chronic kidney disease, stage 3 unspecified: Secondary | ICD-10-CM | POA: Diagnosis not present

## 2022-08-10 DIAGNOSIS — E1129 Type 2 diabetes mellitus with other diabetic kidney complication: Secondary | ICD-10-CM | POA: Diagnosis not present

## 2022-08-10 DIAGNOSIS — I10 Essential (primary) hypertension: Secondary | ICD-10-CM | POA: Diagnosis not present

## 2022-08-10 DIAGNOSIS — E7800 Pure hypercholesterolemia, unspecified: Secondary | ICD-10-CM | POA: Diagnosis not present

## 2022-08-10 DIAGNOSIS — E1169 Type 2 diabetes mellitus with other specified complication: Secondary | ICD-10-CM | POA: Diagnosis not present

## 2022-08-16 DIAGNOSIS — M170 Bilateral primary osteoarthritis of knee: Secondary | ICD-10-CM | POA: Diagnosis not present

## 2022-08-25 DIAGNOSIS — M170 Bilateral primary osteoarthritis of knee: Secondary | ICD-10-CM | POA: Diagnosis not present

## 2022-09-18 DIAGNOSIS — E119 Type 2 diabetes mellitus without complications: Secondary | ICD-10-CM | POA: Diagnosis not present

## 2022-09-18 DIAGNOSIS — I10 Essential (primary) hypertension: Secondary | ICD-10-CM | POA: Diagnosis not present

## 2022-09-19 DIAGNOSIS — H5201 Hypermetropia, right eye: Secondary | ICD-10-CM | POA: Diagnosis not present

## 2022-09-19 DIAGNOSIS — H26492 Other secondary cataract, left eye: Secondary | ICD-10-CM | POA: Diagnosis not present

## 2022-09-19 DIAGNOSIS — H5212 Myopia, left eye: Secondary | ICD-10-CM | POA: Diagnosis not present

## 2022-09-19 DIAGNOSIS — E119 Type 2 diabetes mellitus without complications: Secondary | ICD-10-CM | POA: Diagnosis not present

## 2022-12-01 DIAGNOSIS — M170 Bilateral primary osteoarthritis of knee: Secondary | ICD-10-CM | POA: Diagnosis not present

## 2023-01-22 ENCOUNTER — Other Ambulatory Visit: Payer: Self-pay

## 2023-01-22 ENCOUNTER — Encounter (HOSPITAL_BASED_OUTPATIENT_CLINIC_OR_DEPARTMENT_OTHER): Payer: Self-pay | Admitting: Otolaryngology

## 2023-01-22 ENCOUNTER — Encounter (HOSPITAL_BASED_OUTPATIENT_CLINIC_OR_DEPARTMENT_OTHER)
Admission: RE | Admit: 2023-01-22 | Discharge: 2023-01-22 | Disposition: A | Discharge status: Home / Self Care | Payer: Medicare Other | Source: Ambulatory Visit | Attending: Otolaryngology

## 2023-01-22 DIAGNOSIS — Z01812 Encounter for preprocedural laboratory examination: Secondary | ICD-10-CM | POA: Insufficient documentation

## 2023-01-22 LAB — BASIC METABOLIC PANEL
Anion gap: 10 (ref 5–15)
BUN: 16 mg/dL (ref 8–23)
CO2: 28 mmol/L (ref 22–32)
Calcium: 9.4 mg/dL (ref 8.9–10.3)
Chloride: 101 mmol/L (ref 98–111)
Creatinine, Ser: 1.02 mg/dL — ABNORMAL HIGH (ref 0.44–1.00)
GFR, Estimated: 59 mL/min — ABNORMAL LOW (ref >60)
Glucose, Bld: 82 mg/dL (ref 70–99)
Potassium: 4.6 mmol/L (ref 3.5–5.1)
Sodium: 139 mmol/L (ref 135–145)

## 2023-01-23 NOTE — H&P (Signed)
Complaint: Stridor HPI: Long history of subglottic stenosis, having worsening stridor and exercise intolerance.          Past Medical History:  Diagnosis Date   Arthritis     Complication of anesthesia 12/2015    states "jaw hyperextended and now has TMJ issues"   Complication of anesthesia      generalized muscle weakness after anesthesia for many days   Diabetes mellitus     GERD (gastroesophageal reflux disease)     Hyperlipemia     Hypertension     Subglottic stenosis     Wears glasses      reading               Past Surgical History:  Procedure Laterality Date   CESAREAN SECTION        x2   DIRECT LARYNGOSCOPY        glottic stenosis dilated-multiple times-2003to -2013 #8   KNEE ARTHROSCOPY Left 12/2015   MICROLARYNGOSCOPY WITH DILATION N/A 09/01/2016    Procedure: MICROLARYNGOSCOPY WITH BALLOON DILATION;  Surgeon: Hart Haas, MD;  Location: [facility];  Service: ENT;  Laterality: N/A;   MICROLARYNGOSCOPY WITH LASER AND BALLOON DILATION N/A 08/15/2012    Procedure: MICROLARYNGOSCOPY WITH LASER AND BALLOON DILATION;  Surgeon: Huntley Demedeiros, MD;  Location: [facility];  Service: ENT;  Laterality: N/A;  Tracheal Dilation   MICROLARYNGOSCOPY WITH LASER AND BALLOON DILATION N/A 01/31/2013    Procedure: MICROLARYNGOSCOPY WITH LASER AND BALLOON DILATION;  Surgeon: Ahmere Hemenway, MD;  Location: [facility];  Service: ENT;  Laterality: N/A;   MICROLARYNGOSCOPY WITH LASER AND BALLOON DILATION N/A 08/18/2013    Procedure: MICROLARYNGOSCOPY WITH LASER AND BALLOON DILATION;  Surgeon: Darienne Belleau, MD;  Location: [facility];  Service: ENT;  Laterality: N/A;   MICROLARYNGOSCOPY WITH LASER AND BALLOON DILATION N/A 08/27/2014    Procedure: MICROLARYNGOSCOPY WITH LASER AND BALLOON DILATION;  Surgeon: Vernor Monnig, MD;  Location: [facility];  Service: ENT;  Laterality: N/A;   MICROLARYNGOSCOPY WITH LASER AND BALLOON DILATION N/A  07/15/2015    Procedure: MICROLARYNGOSCOPY WITH LASER AND BALLOON DILATION;  Surgeon: Janaiya Beauchesne, MD;  Location: [facility];  Service: ENT;  Laterality: N/A;   MICROLARYNGOSCOPY WITH LASER AND BALLOON DILATION N/A 01/28/2016    Procedure: MICROLARYNGOSCOPY WITH LASER AND BALLOON DILATION;  Surgeon: Jahmir Salo, MD;  Location: [facility];  Service: ENT;  Laterality: N/A;   MICROLARYNGOSCOPY WITH LASER AND BALLOON DILATION N/A 05/18/2017    Procedure: MICROLARYNGOSCOPY WITH LASER AND BALLOON DILATION;  Surgeon: Zackerie Sara, MD;  Location: [facility];  Service: ENT;  Laterality: N/A;   MICROLARYNGOSCOPY WITH LASER AND BALLOON DILATION N/A 01/28/2018    Procedure: MICROLARYNGOSCOPY with laser balloon dilation;  Surgeon: Hiram Mciver, MD;  [facility];  Service: ENT;  Laterality: N/A;      No family history on file. Social History:  reports that she has quit smoking. She has never used smokeless tobacco. She reports current alcohol use. She reports that she does not use drugs.   Allergies: No Known Allergies   No medications prior to admission.      Lab Results Last 48 Hours  No results found for this or any previous visit (from the past 48 hour(s)).    Imaging Results (Last 48 hours)  No results found.      ROS: otherwise negative   There were no vitals taken for   this visit.   PHYSICAL EXAM: Overall appearance:  Healthy appearing, inspiratory stridor present. Head:  Normocephalic, atraumatic. Ears: External auditory canals are clear; tympanic membranes are intact and the middle ears are free of any effusion. Nose: External nose is healthy in appearance. Internal nasal exam free of any lesions or obstruction. Oral Cavity/pharynx:  There are no mucosal lesions or masses identified. Hypopharynx/Larynx: no signs of any mucosal lesions or masses identified. Vocal cords move normally. Neuro:  No identifiable neurologic  deficits. Neck: No palpable neck masses.   Studies Reviewed: none       Assessment/Plan Revision microlaryngoscopy with laser excision and balloon dilation of subglottic stenosis  

## 2023-01-26 NOTE — Anesthesia Preprocedure Evaluation (Addendum)
Anesthesia Evaluation  Patient identified by MRN, date of birth, ID band Patient awake    Reviewed: Allergy & Precautions, H&P , NPO status , Patient's Chart, lab work & pertinent test results, reviewed documented beta blocker date and time   History of Anesthesia Complications (+) history of anesthetic complications (muscle weakness/pain 2/2 succ, TMJ)  Airway Mallampati: IV  TM Distance: >3 FB Neck ROM: Full    Dental  (+) Teeth Intact, Dental Advisory Given   Pulmonary former smoker Subglottic stenosis    Pulmonary exam normal breath sounds clear to auscultation       Cardiovascular hypertension (153/87), Pt. on medications and Pt. on home beta blockers Normal cardiovascular exam Rhythm:Regular Rate:Normal     Neuro/Psych negative neurological ROS  negative psych ROS   GI/Hepatic negative GI ROS, Neg liver ROS,,,  Endo/Other  diabetes, Well Controlled, Type 2, Oral Hypoglycemic Agents    Renal/GU negative Renal ROS  negative genitourinary   Musculoskeletal  (+) Arthritis , Osteoarthritis,    Abdominal   Peds negative pediatric ROS (+)  Hematology negative hematology ROS (+)   Anesthesia Other Findings Ozempic LD:   Reproductive/Obstetrics negative OB ROS                             Anesthesia Physical Anesthesia Plan  ASA: 3  Anesthesia Plan: General   Post-op Pain Management: Tylenol PO (pre-op)*   Induction: Intravenous  PONV Risk Score and Plan: 4 or greater and Ondansetron, Dexamethasone, Midazolam and Treatment may vary due to age or medical condition  Airway Management Planned: Oral ETT and Video Laryngoscope Planned  Additional Equipment: None  Intra-op Plan:   Post-operative Plan: Extubation in OR  Informed Consent: I have reviewed the patients History and Physical, chart, labs and discussed the procedure including the risks, benefits and alternatives for the  proposed anesthesia with the patient or authorized representative who has indicated his/her understanding and acceptance.     Dental advisory given  Plan Discussed with: CRNA  Anesthesia Plan Comments: (Glidescope + 4.5 oral ETT  Last airway note 06/2022: Ventilation: Mask ventilation without difficulty Laryngoscope Size: Mac and 3 Grade View: Grade II Tube type: Oral Tube size: 4.5 mm Number of attempts: 2 Comments: Unable to pass tube through stricture glide scope used with gentle passage of 4.5 ETT, teeth gums unchanged   )        Anesthesia Quick Evaluation

## 2023-01-29 ENCOUNTER — Ambulatory Visit (HOSPITAL_BASED_OUTPATIENT_CLINIC_OR_DEPARTMENT_OTHER): Payer: Medicare Other | Admitting: Anesthesiology

## 2023-01-29 ENCOUNTER — Ambulatory Visit (HOSPITAL_BASED_OUTPATIENT_CLINIC_OR_DEPARTMENT_OTHER)
Admission: RE | Admit: 2023-01-29 | Discharge: 2023-01-29 | Disposition: A | Discharge status: Home / Self Care | Payer: Medicare Other | Attending: Otolaryngology | Admitting: Otolaryngology

## 2023-01-29 ENCOUNTER — Encounter (HOSPITAL_BASED_OUTPATIENT_CLINIC_OR_DEPARTMENT_OTHER): Payer: Self-pay | Admitting: Otolaryngology

## 2023-01-29 ENCOUNTER — Encounter (HOSPITAL_BASED_OUTPATIENT_CLINIC_OR_DEPARTMENT_OTHER)
Admission: RE | Disposition: A | Discharge status: Home / Self Care | Payer: Self-pay | Source: Home / Self Care | Attending: Otolaryngology

## 2023-01-29 DIAGNOSIS — K219 Gastro-esophageal reflux disease without esophagitis: Secondary | ICD-10-CM | POA: Diagnosis not present

## 2023-01-29 DIAGNOSIS — J386 Stenosis of larynx: Secondary | ICD-10-CM | POA: Insufficient documentation

## 2023-01-29 DIAGNOSIS — Z87891 Personal history of nicotine dependence: Secondary | ICD-10-CM | POA: Diagnosis not present

## 2023-01-29 DIAGNOSIS — I10 Essential (primary) hypertension: Secondary | ICD-10-CM | POA: Insufficient documentation

## 2023-01-29 DIAGNOSIS — E119 Type 2 diabetes mellitus without complications: Secondary | ICD-10-CM | POA: Diagnosis not present

## 2023-01-29 DIAGNOSIS — E785 Hyperlipidemia, unspecified: Secondary | ICD-10-CM | POA: Diagnosis not present

## 2023-01-29 HISTORY — PX: MICROLARYNGOSCOPY WITH LASER AND BALLOON DILATION: SHX5973

## 2023-01-29 LAB — GLUCOSE, CAPILLARY
Glucose-Capillary: 108 mg/dL — ABNORMAL HIGH (ref 70–99)
Glucose-Capillary: 97 mg/dL (ref 70–99)

## 2023-01-29 SURGERY — MICROLARYNGOSCOPY WITH LASER AND BALLOON DILATION
Anesthesia: General | Site: Throat

## 2023-01-29 MED ORDER — AMISULPRIDE (ANTIEMETIC) 5 MG/2ML IV SOLN: 10.0000 mg | Freq: Once | INTRAVENOUS | Status: DC | PRN

## 2023-01-29 MED ORDER — ONDANSETRON HCL 4 MG/2ML IJ SOLN
INTRAMUSCULAR | Status: DC | PRN
  Administered 2023-01-29: 4 mg via INTRAVENOUS

## 2023-01-29 MED ORDER — LIDOCAINE HCL (CARDIAC) PF 100 MG/5ML IV SOSY
PREFILLED_SYRINGE | INTRAVENOUS | Status: DC | PRN
  Administered 2023-01-29: 60 mg via INTRAVENOUS

## 2023-01-29 MED ORDER — CEPHALEXIN 500 MG PO CAPS: 500.0000 mg | ORAL_CAPSULE | Freq: Three times a day (TID) | ORAL | 0 refills | Status: DC

## 2023-01-29 MED ORDER — HYDROMORPHONE HCL 1 MG/ML IJ SOLN: 0.2500 mg | INTRAMUSCULAR | Status: DC | PRN

## 2023-01-29 MED ORDER — SUGAMMADEX SODIUM 500 MG/5ML IV SOLN
INTRAVENOUS | Status: DC | PRN
  Administered 2023-01-29: 300 mg via INTRAVENOUS

## 2023-01-29 MED ORDER — ONDANSETRON HCL 4 MG/2ML IJ SOLN: 4.0000 mg | Freq: Once | INTRAMUSCULAR | Status: DC | PRN

## 2023-01-29 MED ORDER — LIDOCAINE 2% (20 MG/ML) 5 ML SYRINGE
INTRAMUSCULAR | Status: AC
  Filled 2023-01-29: qty 5

## 2023-01-29 MED ORDER — FENTANYL CITRATE (PF) 100 MCG/2ML IJ SOLN
INTRAMUSCULAR | Status: DC | PRN
  Administered 2023-01-29: 100 ug via INTRAVENOUS

## 2023-01-29 MED ORDER — DEXAMETHASONE SODIUM PHOSPHATE 10 MG/ML IJ SOLN
INTRAMUSCULAR | Status: AC
  Filled 2023-01-29: qty 1

## 2023-01-29 MED ORDER — ACETAMINOPHEN 500 MG PO TABS
1000.0000 mg | ORAL_TABLET | Freq: Once | ORAL | Status: AC
  Administered 2023-01-29: 1000 mg via ORAL

## 2023-01-29 MED ORDER — FENTANYL CITRATE (PF) 100 MCG/2ML IJ SOLN
INTRAMUSCULAR | Status: AC
  Filled 2023-01-29: qty 2

## 2023-01-29 MED ORDER — ACETAMINOPHEN 500 MG PO TABS
ORAL_TABLET | ORAL | Status: AC
  Filled 2023-01-29: qty 2

## 2023-01-29 MED ORDER — OXYCODONE HCL 5 MG PO TABS: 5.0000 mg | ORAL_TABLET | Freq: Once | ORAL | Status: DC | PRN

## 2023-01-29 MED ORDER — PROPOFOL 10 MG/ML IV BOLUS
INTRAVENOUS | Status: AC
  Filled 2023-01-29: qty 20

## 2023-01-29 MED ORDER — ONDANSETRON HCL 4 MG/2ML IJ SOLN
INTRAMUSCULAR | Status: AC
  Filled 2023-01-29: qty 2

## 2023-01-29 MED ORDER — ROCURONIUM BROMIDE 100 MG/10ML IV SOLN
INTRAVENOUS | Status: DC | PRN
  Administered 2023-01-29: 50 mg via INTRAVENOUS
  Administered 2023-01-29: 20 mg via INTRAVENOUS

## 2023-01-29 MED ORDER — OXYCODONE HCL 5 MG/5ML PO SOLN: 5.0000 mg | Freq: Once | ORAL | Status: DC | PRN

## 2023-01-29 MED ORDER — LACTATED RINGERS IV SOLN: INTRAVENOUS | Status: DC

## 2023-01-29 MED ORDER — PROPOFOL 500 MG/50ML IV EMUL
INTRAVENOUS | Status: DC | PRN
  Administered 2023-01-29: 250 ug/kg/min via INTRAVENOUS

## 2023-01-29 MED ORDER — PROPOFOL 10 MG/ML IV BOLUS
INTRAVENOUS | Status: DC | PRN
  Administered 2023-01-29: 100 mg via INTRAVENOUS
  Administered 2023-01-29: 150 mg via INTRAVENOUS
  Administered 2023-01-29: 50 mg via INTRAVENOUS

## 2023-01-29 MED ORDER — DEXAMETHASONE SODIUM PHOSPHATE 4 MG/ML IJ SOLN
INTRAMUSCULAR | Status: DC | PRN
  Administered 2023-01-29: 10 mg via INTRAVENOUS

## 2023-01-29 SURGICAL SUPPLY — 32 items
BALLN PULM 12 13.5 15X75 (BALLOONS)
BALLN PULM 15 16.5 18X75 (BALLOONS) ×1
BALLN PULMONARY 10-12 (MISCELLANEOUS) IMPLANT
BALLN PULMONARY 8-10 OD75 (MISCELLANEOUS) IMPLANT
BALLOON PULM 12 13.5 15X75 (BALLOONS) IMPLANT
BALLOON PULM 15 16.5 18X75 (BALLOONS) IMPLANT
BNDG EYE OVAL 2 1/8 X 2 5/8 (GAUZE/BANDAGES/DRESSINGS) ×2 IMPLANT
CANISTER SUCT 1200ML W/VALVE (MISCELLANEOUS) ×1 IMPLANT
DEFOGGER MIRROR 1QT (MISCELLANEOUS) IMPLANT
DEPRESSOR TONGUE 6 IN STERILE (GAUZE/BANDAGES/DRESSINGS) ×1 IMPLANT
GAUZE SPONGE 4X4 12PLY STRL LF (GAUZE/BANDAGES/DRESSINGS) ×2 IMPLANT
GLOVE ECLIPSE 7.5 STRL STRAW (GLOVE) ×1 IMPLANT
GOWN STRL REUS W/ TWL LRG LVL3 (GOWN DISPOSABLE) IMPLANT
GOWN STRL REUS W/ TWL XL LVL3 (GOWN DISPOSABLE) IMPLANT
GOWN STRL REUS W/TWL LRG LVL3 (GOWN DISPOSABLE) ×1
GOWN STRL REUS W/TWL XL LVL3 (GOWN DISPOSABLE)
GUARD TEETH (MISCELLANEOUS) IMPLANT
MARKER SKIN DUAL TIP RULER LAB (MISCELLANEOUS) IMPLANT
NDL SPNL 22GX7 QUINCKE BK (NEEDLE) IMPLANT
NEEDLE SPNL 22GX7 QUINCKE BK (NEEDLE)
NS IRRIG 1000ML POUR BTL (IV SOLUTION) ×1 IMPLANT
PACK BASIN DAY SURGERY FS (CUSTOM PROCEDURE TRAY) ×1 IMPLANT
PATTIES SURGICAL .5 X3 (DISPOSABLE) IMPLANT
SHEET MEDIUM DRAPE 40X70 STRL (DRAPES) ×1 IMPLANT
SLEEVE SCD COMPRESS KNEE MED (STOCKING) IMPLANT
SURGILUBE 2OZ TUBE FLIPTOP (MISCELLANEOUS) IMPLANT
SYR 5ML LL (SYRINGE) IMPLANT
SYR CONTROL 10ML LL (SYRINGE) IMPLANT
SYR GAUGE ASSEMBLY ALLIANCE II (MISCELLANEOUS) IMPLANT
SYR TB 1ML LL NO SAFETY (SYRINGE) IMPLANT
TOWEL GREEN STERILE FF (TOWEL DISPOSABLE) ×1 IMPLANT
TUBE CONNECTING 20X1/4 (TUBING) ×1 IMPLANT

## 2023-01-29 NOTE — Interval H&P Note (Signed)
History and Physical Interval Note:  01/29/2023 9:05 AM  Victoria Mccormick  has presented today for surgery, with the diagnosis of Subglottic stenosis.  The various methods of treatment have been discussed with the patient and family. After consideration of risks, benefits and other options for treatment, the patient has consented to  Procedure(s): MICROLARYNGOSCOPY WITH CO2 LASER AND BALLOON DILATION (N/A) as a surgical intervention.  The patient's history has been reviewed, patient examined, no change in status, stable for surgery.  I have reviewed the patient's chart and labs.  Questions were answered to the patient's satisfaction.     Serena Colonel

## 2023-01-29 NOTE — Anesthesia Postprocedure Evaluation (Signed)
Anesthesia Post Note  Patient: Tieshia P Sawhney  Procedure(s) Performed: MICROLARYNGOSCOPY WITH CO2 LASER AND BALLOON DILATION (Throat)     Patient location during evaluation: PACU Anesthesia Type: General Level of consciousness: awake and alert, oriented and patient cooperative Pain management: pain level controlled Vital Signs Assessment: post-procedure vital signs reviewed and stable Respiratory status: spontaneous breathing, nonlabored ventilation and respiratory function stable Cardiovascular status: blood pressure returned to baseline and stable Postop Assessment: no apparent nausea or vomiting Anesthetic complications: no   No notable events documented.  Last Vitals:  Vitals:   01/29/23 1100 01/29/23 1115  BP: 137/75 (!) 143/73  Pulse: 65 62  Resp: 15 14  Temp: 36.5 C   SpO2: 99% 100%    Last Pain:  Vitals:   01/29/23 1100  TempSrc:   PainSc: 0-No pain                 Lannie Fields

## 2023-01-29 NOTE — Op Note (Signed)
   OPERATIVE REPORT   DATE OF SURGERY: 01/29/2023   PATIENT:  Victoria Mccormick, 72y.o. female   PRE-OPERATIVE DIAGNOSIS:  Subglottic Stenosis   POST-OPERATIVE DIAGNOSIS:  Subglottic Stenosis   PROCEDURE:  Procedure(s): Microlaryngoscopy with carbon dioxide laser and Balloon Dilation Esophagoscopy   SURGEON:  Susy Frizzle, MD   ASSISTANTS: None   ANESTHESIA:   General    EBL: Less than 10 ml   DRAINS: None   LOCAL MEDICATIONS USED:  None   SPECIMEN:  none   COUNTS:  Correct   PROCEDURE DETAILS: The patient was taken to the operating room and placed on the operating table in the supine position. Following induction of general endotracheal anesthesia, using a 4.5 endotracheal tube, the table was turned 90 degrees and the patient was draped for standard operative endoscopy including saline soaked eye pads and saline soaked towels around the face.  A maxillary tooth protector was used throughout the case.  A Jako laryngoscope was entered into the oral cavity used to evaluate the larynx and attached to the Mayo stand with the suspension apparatus.  The endotracheal tube was removed and the airway was inspected.  The tube was then replaced.  Throughout the case the tube was removed and replaced as needed following the direction of anesthesia.  Using the operating microscope the subglottic area was inspected.  There was a relatively small ring of circumferential stenotic tissue that was then lasered using the CO2 laser attachment on the microscope at a setting of 4 W continuous power.  Radial cuts were created at 12:00 10:00 and 8:00 .  Posteriorly there was a soft tissue fullness that was easily compressible.  This area was lasered as well but I did not want to risk perforating the esophagus.  Rigid cervical esophagoscopy was performed to inspect the esophageal mucosa in this area and there was no obvious pathology.  There is no evidence of any thermal damage.  There is no mass or  diverticulum present.  On the right side there was minimal stenosis.  The CRE balloon dilator was then used.  This was inflated up to 4.5 atm was corresponded to 16.5 mm diameter.  This was performed on 2 occasions making sure that the balloon was in the correct position.    At the termination procedure a 7.5 endotracheal tube was placed.  Care with the patient was then taken over by anesthesia.  The endoscope was removed.  Patient was awakened extubated and transferred to recovery in stable condition.       PATIENT DISPOSITION:  To PACU, stable

## 2023-01-29 NOTE — Transfer of Care (Signed)
Immediate Anesthesia Transfer of Care Note  Patient: Victoria Mccormick  Procedure(s) Performed: MICROLARYNGOSCOPY WITH CO2 LASER AND BALLOON DILATION (Throat)  Patient Location: PACU  Anesthesia Type:General  Level of Consciousness: awake, alert , and patient cooperative  Airway & Oxygen Therapy: Patient Spontanous Breathing and Patient connected to face mask oxygen  Post-op Assessment: Report given to RN and Post -op Vital signs reviewed and stable  Post vital signs: Reviewed and stable  Last Vitals:  Vitals Value Taken Time  BP 137/75 01/29/23 1104  Temp    Pulse 65 01/29/23 1105  Resp 14 01/29/23 1105  SpO2 99 % 01/29/23 1105  Vitals shown include unfiled device data.  Last Pain:  Vitals:   01/29/23 0901  TempSrc: Temporal  PainSc: 0-No pain      Patients Stated Pain Goal: 3 (01/29/23 0901)  Complications: No notable events documented.

## 2023-01-29 NOTE — Discharge Instructions (Addendum)
  Post Anesthesia Home Care Instructions  Activity: Get plenty of rest for the remainder of the day. A responsible individual must stay with you for 24 hours following the procedure.  For the next 24 hours, DO NOT: -Drive a car -Advertising copywriter -Drink alcoholic beverages -Take any medication unless instructed by your physician -Make any legal decisions or sign important papers.  Meals: Start with liquid foods such as gelatin or soup. Progress to regular foods as tolerated. Avoid greasy, spicy, heavy foods. If nausea and/or vomiting occur, drink only clear liquids until the nausea and/or vomiting subsides. Call your physician if vomiting continues.  Special Instructions/Symptoms: Your throat may feel dry or sore from the anesthesia or the breathing tube placed in your throat during surgery. If this causes discomfort, gargle with warm salt water. The discomfort should disappear within 24 hours.  If you had a scopolamine patch placed behind your ear for the management of post- operative nausea and/or vomiting:  1. The medication in the patch is effective for 72 hours, after which it should be removed.  Wrap patch in a tissue and discard in the trash. Wash hands thoroughly with soap and water. 2. You may remove the patch earlier than 72 hours if you experience unpleasant side effects which may include dry mouth, dizziness or visual disturbances. 3. Avoid touching the patch. Wash your hands with soap and water after contact with the patch.   Next dose of tylenol if needed after 3pm

## 2023-01-30 ENCOUNTER — Encounter (HOSPITAL_BASED_OUTPATIENT_CLINIC_OR_DEPARTMENT_OTHER): Payer: Self-pay | Admitting: Otolaryngology

## 2023-02-09 DIAGNOSIS — K219 Gastro-esophageal reflux disease without esophagitis: Secondary | ICD-10-CM | POA: Diagnosis not present

## 2023-02-09 DIAGNOSIS — I10 Essential (primary) hypertension: Secondary | ICD-10-CM | POA: Diagnosis not present

## 2023-02-09 DIAGNOSIS — E782 Mixed hyperlipidemia: Secondary | ICD-10-CM | POA: Diagnosis not present

## 2023-02-09 DIAGNOSIS — E118 Type 2 diabetes mellitus with unspecified complications: Secondary | ICD-10-CM | POA: Diagnosis not present

## 2023-02-23 DIAGNOSIS — M170 Bilateral primary osteoarthritis of knee: Secondary | ICD-10-CM | POA: Diagnosis not present

## 2023-05-25 ENCOUNTER — Other Ambulatory Visit: Payer: Self-pay | Admitting: Family Medicine

## 2023-05-25 DIAGNOSIS — Z1231 Encounter for screening mammogram for malignant neoplasm of breast: Secondary | ICD-10-CM

## 2023-07-02 ENCOUNTER — Ambulatory Visit
Admission: RE | Admit: 2023-07-02 | Discharge: 2023-07-02 | Disposition: A | Discharge status: Home / Self Care | Source: Ambulatory Visit | Attending: Family Medicine | Admitting: Family Medicine

## 2023-07-02 DIAGNOSIS — Z1231 Encounter for screening mammogram for malignant neoplasm of breast: Secondary | ICD-10-CM

## 2023-08-10 DIAGNOSIS — M170 Bilateral primary osteoarthritis of knee: Secondary | ICD-10-CM | POA: Diagnosis not present

## 2023-08-17 DIAGNOSIS — E118 Type 2 diabetes mellitus with unspecified complications: Secondary | ICD-10-CM | POA: Diagnosis not present

## 2023-08-17 DIAGNOSIS — N182 Chronic kidney disease, stage 2 (mild): Secondary | ICD-10-CM | POA: Diagnosis not present

## 2023-08-17 DIAGNOSIS — Z0000 Encounter for general adult medical examination without abnormal findings: Secondary | ICD-10-CM | POA: Diagnosis not present

## 2023-08-17 DIAGNOSIS — I10 Essential (primary) hypertension: Secondary | ICD-10-CM | POA: Diagnosis not present

## 2023-08-17 DIAGNOSIS — E782 Mixed hyperlipidemia: Secondary | ICD-10-CM | POA: Diagnosis not present

## 2023-08-17 DIAGNOSIS — K219 Gastro-esophageal reflux disease without esophagitis: Secondary | ICD-10-CM | POA: Diagnosis not present

## 2023-08-31 ENCOUNTER — Encounter (HOSPITAL_BASED_OUTPATIENT_CLINIC_OR_DEPARTMENT_OTHER): Payer: Self-pay | Admitting: Otolaryngology

## 2023-08-31 ENCOUNTER — Other Ambulatory Visit: Payer: Self-pay

## 2023-09-03 ENCOUNTER — Encounter (HOSPITAL_BASED_OUTPATIENT_CLINIC_OR_DEPARTMENT_OTHER)
Admission: RE | Admit: 2023-09-03 | Discharge: 2023-09-03 | Disposition: A | Discharge status: Home / Self Care | Source: Ambulatory Visit | Attending: Otolaryngology

## 2023-09-03 DIAGNOSIS — E119 Type 2 diabetes mellitus without complications: Secondary | ICD-10-CM | POA: Diagnosis not present

## 2023-09-03 DIAGNOSIS — Z01818 Encounter for other preprocedural examination: Secondary | ICD-10-CM | POA: Insufficient documentation

## 2023-09-03 LAB — BASIC METABOLIC PANEL WITH GFR
Anion gap: 10 (ref 5–15)
BUN: 14 mg/dL (ref 8–23)
CO2: 27 mmol/L (ref 22–32)
Calcium: 9.2 mg/dL (ref 8.9–10.3)
Chloride: 103 mmol/L (ref 98–111)
Creatinine, Ser: 0.81 mg/dL (ref 0.44–1.00)
GFR, Estimated: >60 mL/min (ref >60)
Glucose, Bld: 106 mg/dL — ABNORMAL HIGH (ref 70–99)
Potassium: 4.2 mmol/L (ref 3.5–5.1)
Sodium: 140 mmol/L (ref 135–145)

## 2023-09-07 NOTE — H&P (Addendum)
 HPI:   Victoria Mccormick is a 73 y.o. female who presents as a consult Patient.   Referring Provider: Merdis Stalling*  Chief complaint: Subglottic stenosis.  HPI: History of idiopathic subglottic stenosis for years, status post multiple endoscopic balloon treatments. Her last procedure was May of last year and she has done extremely well since then. She has a couple of trips coming up in the next few weeks and wants to make sure that she is okay to travel. She really has not had much in the way of symptoms.  PMH/Meds/All/SocHx/FamHx/ROS:   Past Medical History:  Diagnosis Date   Diabetes mellitus (HCC)   High cholesterol   Hypertension   Subglottic stenosis   Past Surgical History:  Procedure Laterality Date   LARYNGEAL DILATION   No family history of bleeding disorders, wound healing problems or difficulty with anesthesia.   Social History   Socioeconomic History   Marital status: Widowed  Spouse name: Not on file   Number of children: Not on file   Years of education: Not on file   Highest education level: Not on file  Occupational History   Not on file  Tobacco Use   Smoking status: Former   Smokeless tobacco: Never  Substance and Sexual Activity   Alcohol use: No   Drug use: No   Sexual activity: Not on file  Other Topics Concern   Not on file  Social History Narrative   Not on file   Social Determinants of Health   Food Insecurity: Not on file  Transportation Needs: Not on file  Living Situation: Not on file   Current Outpatient Medications:   azelastine (ASTELIN) 137 mcg (0.1 %) nasal spray, USE 2 SPRAYS IN EACH NOSTRIL TWICE DAILY, Disp: , Rfl:   empagliflozin (JARDIANCE) 25 mg Tab tablet, Take 1 tablet (25 mg total) by mouth daily., Disp: , Rfl:   esomeprazole (NEXIUM) 40 MG capsule, TK 1 C PO QD, Disp: , Rfl: 1  glimepiride (AMARYL) 1 MG tablet, glimepiride 1 mg tablet TAKE 1 TABLET BY MOUTH EVERY DAY WITH BREAKFAST OR THE FIRST MAIN MEAL OF  THE DAY FOR DIABETES, Disp: , Rfl:   lisinopril (PRINIVIL,ZESTRIL) 20 MG tablet, TK 1 T PO QD FOR BP, Disp: , Rfl: 1  metFORMIN ER (GLUCOPHAGE-XR) 500 MG extended release tablet, TK 2 TS PO BID WF FOR DIABETES, Disp: , Rfl: 1  metoPROLOL succinate (TOPROL-XL) 50 MG 24 hr tablet, TK 1 T PO QD FOR BP, Disp: , Rfl: 1  ONGLYZA 5 mg Tab, TK 1 T PO QD FOR DIABETES, Disp: , Rfl: 1  OZEMPIC 0.25 mg or 0.5 mg (2 mg/3 mL) injection, , Disp: , Rfl:   pantoprazole (PROTONIX) 40 MG tablet, pantoprazole 40 mg tablet,delayed release TAKE 1 TABLET BY MOUTH EVERY DAY, Disp: , Rfl:   PEG-electrolytes (NULYTELY, GAVILYTE-N) 420 gram SolR solution, MIX AND DRINK AS DIRECTED, Disp: , Rfl:   simvastatin (ZOCOR) 40 MG tablet, TK 1 T PO QD FOR CHOLESTEROL, Disp: , Rfl: 1  SITagliptin phosphate (JANUVIA) 100 MG tablet, Januvia 100 mg tablet TAKE 1 TABLET BY MOUTH EVERY DAY FOR DIABETES, Disp: , Rfl:   triamterene-hydrochlorothiazide (MAXZIDE-25) 37.5-25 mg per tablet, TK 1 T PO QD FOR BP, Disp: , Rfl: 1  valsartan (DIOVAN) 320 MG tablet, valsartan 320 mg tablet TAKE 1 TABLET BY MOUTH EVERY DAY FOR BLOOD PRESSURE, Disp: , Rfl:   A complete ROS was performed with pertinent positives/negatives noted in the HPI. The remainder  of the ROS are negative.   Physical Exam:   Pulse 72  Temp (!) 96.6 F (35.9 C) (Temporal)  Resp 18  Ht 1.702 m (5' 7)  Wt 70.7 kg (155 lb 12.8 oz)  SpO2 98%  BMI 24.40 kg/m   General: Healthy and alert, in no distress, breathing easily. Normal affect. In a pleasant mood. Head: Normocephalic, atraumatic. No masses, or scars. Eyes: Pupils are equal, and reactive to light. Vision is grossly intact. No spontaneous or gaze nystagmus. Ears: Ear canals are clear. Tympanic membranes are intact, with normal landmarks and the middle ears are clear and healthy. Hearing: Grossly normal. Nose: Nasal cavities are clear with healthy mucosa, no polyps or exudate. Airways are patent. Face: No masses or  scars, facial nerve function is symmetric. Oral Cavity: No mucosal abnormalities are noted. Tongue with normal mobility. Dentition appears healthy. Oropharynx: There are no mucosal masses identified. Tongue base appears normal and healthy. Larynx/Hypopharynx: indirect exam reveals healthy, mobile vocal cords, without mucosal lesions in the hypopharynx or larynx. Subglottic larynx reveals circumferential narrowing. With forced inspiration she has pretty tight stridor but with relaxed breathing her breathing is clear and she is moving air well. Chest: Deferred Neck: No palpable masses, no cervical adenopathy, no thyroid nodules or enlargement. Neuro: Cranial nerves II-XII with normal function. Balance: Normal gate. Other findings: none.  Independent Review of Additional Tests or Records:  none  Procedures:  none  Impression & Plans:  Stable exam. She may proceed with any traveling as long as she is feeling well and not having overt symptoms. Contact us  anytime she has enough difficulty where we need to do another endoscopic dilation.

## 2023-09-17 ENCOUNTER — Ambulatory Visit (HOSPITAL_BASED_OUTPATIENT_CLINIC_OR_DEPARTMENT_OTHER): Admitting: Certified Registered Nurse Anesthetist

## 2023-09-17 ENCOUNTER — Encounter (HOSPITAL_BASED_OUTPATIENT_CLINIC_OR_DEPARTMENT_OTHER): Payer: Self-pay | Admitting: Otolaryngology

## 2023-09-17 ENCOUNTER — Encounter (HOSPITAL_BASED_OUTPATIENT_CLINIC_OR_DEPARTMENT_OTHER)
Admission: RE | Disposition: A | Discharge status: Home / Self Care | Payer: Self-pay | Source: Home / Self Care | Attending: Otolaryngology

## 2023-09-17 ENCOUNTER — Other Ambulatory Visit: Payer: Self-pay

## 2023-09-17 ENCOUNTER — Ambulatory Visit (HOSPITAL_BASED_OUTPATIENT_CLINIC_OR_DEPARTMENT_OTHER)
Admission: RE | Admit: 2023-09-17 | Discharge: 2023-09-17 | Disposition: A | Discharge status: Home / Self Care | Attending: Otolaryngology | Admitting: Otolaryngology

## 2023-09-17 DIAGNOSIS — M1990 Unspecified osteoarthritis, unspecified site: Secondary | ICD-10-CM | POA: Diagnosis not present

## 2023-09-17 DIAGNOSIS — I10 Essential (primary) hypertension: Secondary | ICD-10-CM

## 2023-09-17 DIAGNOSIS — Z87891 Personal history of nicotine dependence: Secondary | ICD-10-CM

## 2023-09-17 DIAGNOSIS — Z7984 Long term (current) use of oral hypoglycemic drugs: Secondary | ICD-10-CM | POA: Diagnosis not present

## 2023-09-17 DIAGNOSIS — J386 Stenosis of larynx: Secondary | ICD-10-CM | POA: Insufficient documentation

## 2023-09-17 DIAGNOSIS — K219 Gastro-esophageal reflux disease without esophagitis: Secondary | ICD-10-CM | POA: Diagnosis not present

## 2023-09-17 DIAGNOSIS — E119 Type 2 diabetes mellitus without complications: Secondary | ICD-10-CM | POA: Diagnosis not present

## 2023-09-17 DIAGNOSIS — E7800 Pure hypercholesterolemia, unspecified: Secondary | ICD-10-CM | POA: Diagnosis not present

## 2023-09-17 DIAGNOSIS — N183 Chronic kidney disease, stage 3 unspecified: Secondary | ICD-10-CM | POA: Diagnosis not present

## 2023-09-17 DIAGNOSIS — Z79899 Other long term (current) drug therapy: Secondary | ICD-10-CM | POA: Diagnosis not present

## 2023-09-17 DIAGNOSIS — E1129 Type 2 diabetes mellitus with other diabetic kidney complication: Secondary | ICD-10-CM | POA: Diagnosis not present

## 2023-09-17 DIAGNOSIS — E1169 Type 2 diabetes mellitus with other specified complication: Secondary | ICD-10-CM | POA: Diagnosis not present

## 2023-09-17 DIAGNOSIS — E782 Mixed hyperlipidemia: Secondary | ICD-10-CM | POA: Diagnosis not present

## 2023-09-17 HISTORY — PX: MICROLARYNGOSCOPY: SHX5208

## 2023-09-17 HISTORY — PX: BALLOON DILATION: SHX5330

## 2023-09-17 HISTORY — PX: CO2 LASER APPLICATION: SHX5778

## 2023-09-17 LAB — GLUCOSE, CAPILLARY
Glucose-Capillary: 93 mg/dL (ref 70–99)
Glucose-Capillary: 92 mg/dL (ref 70–99)

## 2023-09-17 SURGERY — MICROLARYNGOSCOPY
Anesthesia: General | Site: Throat

## 2023-09-17 MED ORDER — ONDANSETRON HCL 4 MG/2ML IJ SOLN
INTRAMUSCULAR | Status: AC
  Filled 2023-09-17: qty 2

## 2023-09-17 MED ORDER — LIDOCAINE 2% (20 MG/ML) 5 ML SYRINGE
INTRAMUSCULAR | Status: AC
  Filled 2023-09-17: qty 5

## 2023-09-17 MED ORDER — ROCURONIUM BROMIDE 10 MG/ML (PF) SYRINGE
PREFILLED_SYRINGE | INTRAVENOUS | Status: AC
Start: 2023-09-17 — End: 2023-09-17
  Filled 2023-09-17: qty 10

## 2023-09-17 MED ORDER — LIDOCAINE HCL (CARDIAC) PF 100 MG/5ML IV SOSY
PREFILLED_SYRINGE | INTRAVENOUS | Status: DC | PRN
  Administered 2023-09-17: 60 mg via INTRAVENOUS

## 2023-09-17 MED ORDER — FENTANYL CITRATE (PF) 100 MCG/2ML IJ SOLN
INTRAMUSCULAR | Status: AC
Start: 2023-09-17 — End: 2023-09-17
  Filled 2023-09-17: qty 2

## 2023-09-17 MED ORDER — DEXAMETHASONE SODIUM PHOSPHATE 4 MG/ML IJ SOLN
INTRAMUSCULAR | Status: DC | PRN
  Administered 2023-09-17: 10 mg via INTRAVENOUS

## 2023-09-17 MED ORDER — PROPOFOL 10 MG/ML IV BOLUS
INTRAVENOUS | Status: DC | PRN
  Administered 2023-09-17: 200 ug/kg/min via INTRAVENOUS
  Administered 2023-09-17: 50 mg via INTRAVENOUS
  Administered 2023-09-17: 100 mg via INTRAVENOUS

## 2023-09-17 MED ORDER — ACETAMINOPHEN 500 MG PO TABS
ORAL_TABLET | ORAL | Status: AC
  Filled 2023-09-17: qty 2

## 2023-09-17 MED ORDER — OXYCODONE HCL 5 MG PO TABS: 5.0000 mg | ORAL_TABLET | Freq: Once | ORAL | Status: DC | PRN

## 2023-09-17 MED ORDER — MIDAZOLAM HCL 2 MG/2ML IJ SOLN
INTRAMUSCULAR | Status: AC
Start: 2023-09-17 — End: 2023-09-17
  Filled 2023-09-17: qty 2

## 2023-09-17 MED ORDER — LACTATED RINGERS IV SOLN
INTRAVENOUS | Status: DC
Start: 2023-09-17 — End: 2023-09-17

## 2023-09-17 MED ORDER — PROPOFOL 10 MG/ML IV BOLUS
INTRAVENOUS | Status: AC
  Filled 2023-09-17: qty 20

## 2023-09-17 MED ORDER — OXYCODONE HCL 5 MG/5ML PO SOLN: 5.0000 mg | Freq: Once | ORAL | Status: DC | PRN

## 2023-09-17 MED ORDER — DEXAMETHASONE SODIUM PHOSPHATE 10 MG/ML IJ SOLN
INTRAMUSCULAR | Status: AC
  Filled 2023-09-17: qty 1

## 2023-09-17 MED ORDER — SUGAMMADEX SODIUM 200 MG/2ML IV SOLN
INTRAVENOUS | Status: DC | PRN
  Administered 2023-09-17: 200 mg via INTRAVENOUS

## 2023-09-17 MED ORDER — HYDROMORPHONE HCL 1 MG/ML IJ SOLN: 0.2500 mg | INTRAMUSCULAR | Status: DC | PRN

## 2023-09-17 MED ORDER — AMOXICILLIN 500 MG PO CAPS: 500.0000 mg | ORAL_CAPSULE | Freq: Three times a day (TID) | ORAL | 0 refills | Status: AC

## 2023-09-17 MED ORDER — ARTIFICIAL TEARS OPHTHALMIC OINT
TOPICAL_OINTMENT | OPHTHALMIC | Status: AC
  Filled 2023-09-17: qty 3.5

## 2023-09-17 MED ORDER — ROCURONIUM BROMIDE 100 MG/10ML IV SOLN
INTRAVENOUS | Status: DC | PRN
  Administered 2023-09-17: 50 mg via INTRAVENOUS

## 2023-09-17 MED ORDER — ONDANSETRON HCL 4 MG/2ML IJ SOLN
INTRAMUSCULAR | Status: DC | PRN
  Administered 2023-09-17: 4 mg via INTRAVENOUS

## 2023-09-17 MED ORDER — ACETAMINOPHEN 500 MG PO TABS
1000.0000 mg | ORAL_TABLET | Freq: Once | ORAL | Status: AC
  Administered 2023-09-17: 1000 mg via ORAL

## 2023-09-17 MED ORDER — FENTANYL CITRATE (PF) 100 MCG/2ML IJ SOLN
INTRAMUSCULAR | Status: DC | PRN
  Administered 2023-09-17: 50 ug via INTRAVENOUS

## 2023-09-17 MED ORDER — ONDANSETRON HCL 4 MG/2ML IJ SOLN: 4.0000 mg | Freq: Once | INTRAMUSCULAR | Status: DC | PRN

## 2023-09-17 MED ORDER — MIDAZOLAM HCL 5 MG/5ML IJ SOLN
INTRAMUSCULAR | Status: DC | PRN
  Administered 2023-09-17: 1 mg via INTRAVENOUS

## 2023-09-17 MED ORDER — AMISULPRIDE (ANTIEMETIC) 5 MG/2ML IV SOLN: 10.0000 mg | Freq: Once | INTRAVENOUS | Status: DC | PRN

## 2023-09-17 SURGICAL SUPPLY — 29 items
BALLN PULMONARY 10-12 (MISCELLANEOUS) IMPLANT
BALLN PULMONARY 8-10 OD75 (MISCELLANEOUS) IMPLANT
BALLOON PULM 12 13.5 15X75 (BALLOONS) IMPLANT
BALLOON PULM 15 16.5 18X75 (BALLOONS) IMPLANT
BNDG EYE OVAL 2 1/8 X 2 5/8 (GAUZE/BANDAGES/DRESSINGS) ×2 IMPLANT
CANISTER SUCT 1200ML W/VALVE (MISCELLANEOUS) ×1 IMPLANT
DEFOGGER MIRROR 1QT (MISCELLANEOUS) IMPLANT
DEPRESSOR TONGUE 6 IN STERILE (GAUZE/BANDAGES/DRESSINGS) ×1 IMPLANT
GAUZE SPONGE 4X4 12PLY STRL LF (GAUZE/BANDAGES/DRESSINGS) ×2 IMPLANT
GLOVE BIOGEL PI IND STRL 7.5 (GLOVE) IMPLANT
GLOVE ECLIPSE 7.5 STRL STRAW (GLOVE) ×1 IMPLANT
GOWN STRL REUS W/ TWL LRG LVL3 (GOWN DISPOSABLE) IMPLANT
GOWN STRL REUS W/ TWL XL LVL3 (GOWN DISPOSABLE) IMPLANT
GUARD TEETH (MISCELLANEOUS) IMPLANT
MARKER SKIN DUAL TIP RULER LAB (MISCELLANEOUS) IMPLANT
NDL SPNL 22GX7 QUINCKE BK (NEEDLE) IMPLANT
NEEDLE SPNL 22GX7 QUINCKE BK (NEEDLE) IMPLANT
NS IRRIG 1000ML POUR BTL (IV SOLUTION) ×1 IMPLANT
PACK BASIN DAY SURGERY FS (CUSTOM PROCEDURE TRAY) ×1 IMPLANT
PATTIES SURGICAL .5 X3 (DISPOSABLE) IMPLANT
SHEET MEDIUM DRAPE 40X70 STRL (DRAPES) ×1 IMPLANT
SLEEVE SCD COMPRESS KNEE MED (STOCKING) IMPLANT
SURGILUBE 2OZ TUBE FLIPTOP (MISCELLANEOUS) IMPLANT
SYR 5ML LL (SYRINGE) IMPLANT
SYR CONTROL 10ML LL (SYRINGE) IMPLANT
SYR GAUGE ASSEMBLY ALLIANCE II (MISCELLANEOUS) IMPLANT
SYR TB 1ML LL NO SAFETY (SYRINGE) IMPLANT
TOWEL GREEN STERILE FF (TOWEL DISPOSABLE) ×1 IMPLANT
TUBE CONNECTING 20X1/4 (TUBING) ×1 IMPLANT

## 2023-09-17 NOTE — Anesthesia Procedure Notes (Addendum)
 Procedure Name: Intubation Date/Time: 09/17/2023 12:52 PM  Performed by: Buster Catheryn SAUNDERS, CRNAPre-anesthesia Checklist: Patient identified, Emergency Drugs available, Suction available and Patient being monitored Patient Re-evaluated:Patient Re-evaluated prior to induction Oxygen Delivery Method: Circle system utilized Preoxygenation: Pre-oxygenation with 100% oxygen Induction Type: IV induction Ventilation: Mask ventilation without difficulty Laryngoscope Size: Glidescope and 3 Tube type: Oral Tube size: 4.5 mm Number of attempts: 1 Airway Equipment and Method: Stylet and Video-laryngoscopy Placement Confirmation: ETT inserted through vocal cords under direct vision, positive ETCO2 and breath sounds checked- equal and bilateral Secured at: 22 cm Tube secured with: Tape Dental Injury: Teeth and Oropharynx as per pre-operative assessment

## 2023-09-17 NOTE — Discharge Instructions (Signed)
  Post Anesthesia Home Care Instructions  Activity: Get plenty of rest for the remainder of the day. A responsible individual must stay with you for 24 hours following the procedure.  For the next 24 hours, DO NOT: -Drive a car -Advertising copywriter -Drink alcoholic beverages -Take any medication unless instructed by your physician -Make any legal decisions or sign important papers.  Meals: Start with liquid foods such as gelatin or soup. Progress to regular foods as tolerated. Avoid greasy, spicy, heavy foods. If nausea and/or vomiting occur, drink only clear liquids until the nausea and/or vomiting subsides. Call your physician if vomiting continues.  Special Instructions/Symptoms: Your throat may feel dry or sore from the anesthesia or the breathing tube placed in your throat during surgery. If this causes discomfort, gargle with warm salt water. The discomfort should disappear within 24 hours.  If you had a scopolamine  patch placed behind your ear for the management of post- operative nausea and/or vomiting:  1. The medication in the patch is effective for 72 hours, after which it should be removed.  Wrap patch in a tissue and discard in the trash. Wash hands thoroughly with soap and water. 2. You may remove the patch earlier than 72 hours if you experience unpleasant side effects which may include dry mouth, dizziness or visual disturbances. 3. Avoid touching the patch. Wash your hands with soap and water after contact with the patch.      Next dose of Tylenol  maybe taken at 5:30p

## 2023-09-17 NOTE — Transfer of Care (Signed)
 Immediate Anesthesia Transfer of Care Note  Patient: Victoria Mccormick  Procedure(s) Performed: MICROLARYNGOSCOPY (Throat) CO2 LASER APPLICATION (Throat) BALLOON DILATION OF SUBGLOTTIC STENOSIS (Throat)  Patient Location: PACU  Anesthesia Type:General  Level of Consciousness: awake, alert , and oriented  Airway & Oxygen Therapy: Patient Spontanous Breathing and Patient connected to face mask oxygen  Post-op Assessment: Report given to RN and Post -op Vital signs reviewed and stable  Post vital signs: Reviewed and stable  Last Vitals:  Vitals Value Taken Time  BP 148/79 09/17/23 13:45  Temp    Pulse 57 09/17/23 13:46  Resp 10 09/17/23 13:46  SpO2 100 % 09/17/23 13:46  Vitals shown include unfiled device data.  Last Pain:  Vitals:   09/17/23 1129  TempSrc: Tympanic  PainSc: 0-No pain      Patients Stated Pain Goal: 5 (09/17/23 1129)  Complications: No notable events documented.

## 2023-09-17 NOTE — Anesthesia Postprocedure Evaluation (Signed)
 Anesthesia Post Note  Patient: Victoria Mccormick  Procedure(s) Performed: MICROLARYNGOSCOPY (Throat) CO2 LASER APPLICATION (Throat) BALLOON DILATION OF SUBGLOTTIC STENOSIS (Throat)     Patient location during evaluation: PACU Anesthesia Type: General Level of consciousness: awake and alert, oriented and patient cooperative Pain management: pain level controlled Vital Signs Assessment: post-procedure vital signs reviewed and stable Respiratory status: spontaneous breathing, nonlabored ventilation and respiratory function stable Cardiovascular status: blood pressure returned to baseline and stable Postop Assessment: no apparent nausea or vomiting Anesthetic complications: no   No notable events documented.  Last Vitals:  Vitals:   09/17/23 1347 09/17/23 1400  BP: 139/72 (!) 143/75  Pulse: (!) 58 (!) 58  Resp: 11 14  Temp:    SpO2: 100% 98%    Last Pain:  Vitals:   09/17/23 1400  TempSrc:   PainSc: 0-No pain                 [name]

## 2023-09-17 NOTE — Anesthesia Preprocedure Evaluation (Addendum)
 Anesthesia Evaluation  Patient identified by MRN, date of birth, ID band Patient awake    Reviewed: Allergy & Precautions, H&P , NPO status , Patient's Chart, lab work & pertinent test results, reviewed documented beta blocker date and time   History of Anesthesia Complications (+) history of anesthetic complications (muscle weakness/pain 2/2 succ, TMJ)  Airway Mallampati: IV  TM Distance: >3 FB Neck ROM: Full    Dental  (+) Teeth Intact, Dental Advisory Given   Pulmonary former smoker Subglottic stenosis TMJ   Pulmonary exam normal breath sounds clear to auscultation       Cardiovascular hypertension, Pt. on medications and Pt. on home beta blockers Normal cardiovascular exam Rhythm:Regular Rate:Normal     Neuro/Psych negative neurological ROS  negative psych ROS   GI/Hepatic Neg liver ROS,GERD  Controlled,,  Endo/Other  diabetes, Well Controlled, Type 2, Oral Hypoglycemic Agents    Renal/GU negative Renal ROS  negative genitourinary   Musculoskeletal  (+) Arthritis , Osteoarthritis,    Abdominal   Peds negative pediatric ROS (+)  Hematology negative hematology ROS (+)   Anesthesia Other Findings Semaglutide tabs  Reproductive/Obstetrics negative OB ROS                              Anesthesia Physical Anesthesia Plan  ASA: 3  Anesthesia Plan: General   Post-op Pain Management: Tylenol  PO (pre-op)*   Induction: Intravenous  PONV Risk Score and Plan: 4 or greater and Ondansetron , Dexamethasone , Midazolam  and Treatment may vary due to age or medical condition  Airway Management Planned: Oral ETT and Video Laryngoscope Planned  Additional Equipment: None  Intra-op Plan:   Post-operative Plan: Extubation in OR  Informed Consent: I have reviewed the patients History and Physical, chart, labs and discussed the procedure including the risks, benefits and alternatives for the  proposed anesthesia with the patient or authorized representative who has indicated his/her understanding and acceptance.     Dental advisory given  Plan Discussed with: CRNA  Anesthesia Plan Comments: (Glidescope + 4.5 oral ETT  Last airway note 06/2022: Ventilation: Mask ventilation without difficulty Laryngoscope Size: Mac and 3 Grade View: Grade II Tube type: Oral Tube size: 4.5 mm Number of attempts: 2 Comments: Unable to pass tube through stricture glide scope used with gentle passage of 4.5 ETT, teeth gums unchanged   )         Anesthesia Quick Evaluation

## 2023-09-17 NOTE — Interval H&P Note (Signed)
 History and Physical Interval Note:  09/17/2023 12:16 PM  Victoria Mccormick  has presented today for surgery, with the diagnosis of Stenosis of larynx.  The various methods of treatment have been discussed with the patient and family. After consideration of risks, benefits and other options for treatment, the patient has consented to  Procedure(s) with comments: MICROLARYNGOSCOPY (N/A) - Microlaryngoscopy with CO2 laser and ballon dilation CO2 LASER APPLICATION (N/A) BALLOON DILATION (N/A) as a surgical intervention.  The patient's history has been reviewed, patient examined, no change in status, stable for surgery.  I have reviewed the patient's chart and labs.  Questions were answered to the patient's satisfaction.     [name]

## 2023-09-17 NOTE — Op Note (Signed)
 OPERATIVE REPORT   DATE OF SURGERY: 09/17/2023   PATIENT:  Victoria Mccormick, 73 y.o. female   PRE-OPERATIVE DIAGNOSIS:  Subglottic Stenosis   POST-OPERATIVE DIAGNOSIS:  Subglottic Stenosis   PROCEDURE:  Procedure(s): Microlaryngoscopy with carbon dioxide laser and Balloon Dilation Esophagoscopy   SURGEON:  Ida VEAR Loader, MD   ASSISTANTS: None   ANESTHESIA:   General    EBL: Less than 10 ml   DRAINS: None   LOCAL MEDICATIONS USED:  None   SPECIMEN:  none   COUNTS:  Correct   PROCEDURE DETAILS: The patient was taken to the operating room and placed on the operating table in the supine position. Following induction of general endotracheal anesthesia, using a 4.5 endotracheal tube, the table was turned 90 degrees and the patient was draped for standard operative endoscopy including saline soaked eye pads and saline soaked towels around the face.  A maxillary tooth protector was used throughout the case.  A Jako laryngoscope was entered into the oral cavity used to evaluate the larynx and attached to the Mayo stand with the suspension apparatus.  The endotracheal tube was removed and the airway was inspected.  The tube was then replaced.  Throughout the case the tube was removed and replaced as needed following the direction of anesthesia.  Using the operating microscope the subglottic area was inspected.  There was a relatively small ring of circumferential stenotic tissue that was then lasered using the CO2 laser attachment on the microscope at a setting of 3 W continuous power.  Radial cuts were created at 10:00 and 2:00 .      On the right side there was minimal stenosis.  The CRE balloon dilator was then used.  This was inflated up to 7 atm was corresponded to 18 mm diameter.  This was performed on 2 occasions making sure that the balloon was in the correct position.    At the termination procedure a 7.0 endotracheal tube was placed.  Care with the patient was then taken over by  anesthesia.  The endoscope was removed.  Patient was awakened extubated and transferred to recovery in stable condition.       PATIENT DISPOSITION:  To PACU, stable

## 2023-09-18 ENCOUNTER — Encounter (HOSPITAL_BASED_OUTPATIENT_CLINIC_OR_DEPARTMENT_OTHER): Payer: Self-pay | Admitting: Otolaryngology

## 2023-10-08 DIAGNOSIS — H26492 Other secondary cataract, left eye: Secondary | ICD-10-CM | POA: Diagnosis not present

## 2023-10-08 DIAGNOSIS — H35371 Puckering of macula, right eye: Secondary | ICD-10-CM | POA: Diagnosis not present

## 2023-10-08 DIAGNOSIS — H524 Presbyopia: Secondary | ICD-10-CM | POA: Diagnosis not present

## 2023-10-18 DIAGNOSIS — E782 Mixed hyperlipidemia: Secondary | ICD-10-CM | POA: Diagnosis not present

## 2023-10-18 DIAGNOSIS — E1129 Type 2 diabetes mellitus with other diabetic kidney complication: Secondary | ICD-10-CM | POA: Diagnosis not present

## 2023-10-18 DIAGNOSIS — N183 Chronic kidney disease, stage 3 unspecified: Secondary | ICD-10-CM | POA: Diagnosis not present

## 2023-10-18 DIAGNOSIS — E1169 Type 2 diabetes mellitus with other specified complication: Secondary | ICD-10-CM | POA: Diagnosis not present

## 2023-11-05 DIAGNOSIS — H26492 Other secondary cataract, left eye: Secondary | ICD-10-CM | POA: Diagnosis not present

## 2023-11-12 DIAGNOSIS — M170 Bilateral primary osteoarthritis of knee: Secondary | ICD-10-CM | POA: Diagnosis not present

## 2023-11-18 DIAGNOSIS — E1129 Type 2 diabetes mellitus with other diabetic kidney complication: Secondary | ICD-10-CM | POA: Diagnosis not present

## 2023-11-18 DIAGNOSIS — N183 Chronic kidney disease, stage 3 unspecified: Secondary | ICD-10-CM | POA: Diagnosis not present

## 2023-11-18 DIAGNOSIS — E782 Mixed hyperlipidemia: Secondary | ICD-10-CM | POA: Diagnosis not present

## 2023-11-18 DIAGNOSIS — E1169 Type 2 diabetes mellitus with other specified complication: Secondary | ICD-10-CM | POA: Diagnosis not present

## 2023-11-23 DIAGNOSIS — M170 Bilateral primary osteoarthritis of knee: Secondary | ICD-10-CM | POA: Diagnosis not present

## 2023-12-14 IMAGING — MG MM DIGITAL SCREENING BILAT W/ TOMO AND CAD
8 series · 8 of 24 positions shown · non-contrast
Comparison: Previous exam(s).

CLINICAL DATA: Screening.

EXAM:
DIGITAL SCREENING BILATERAL MAMMOGRAM WITH TOMOSYNTHESIS AND CAD
TECHNIQUE: Bilateral screening digital craniocaudal and mediolateral oblique
mammograms were obtained. Bilateral screening digital breast
tomosynthesis was performed. The images were evaluated with
computer-aided detection.

[L CC synth-2D]
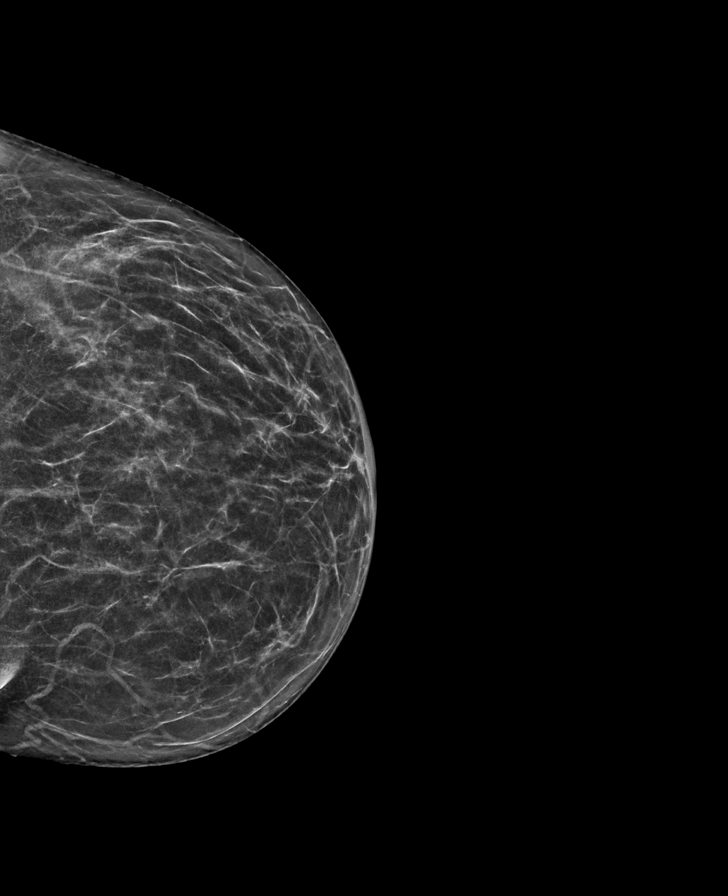

[R CC synth-2D]
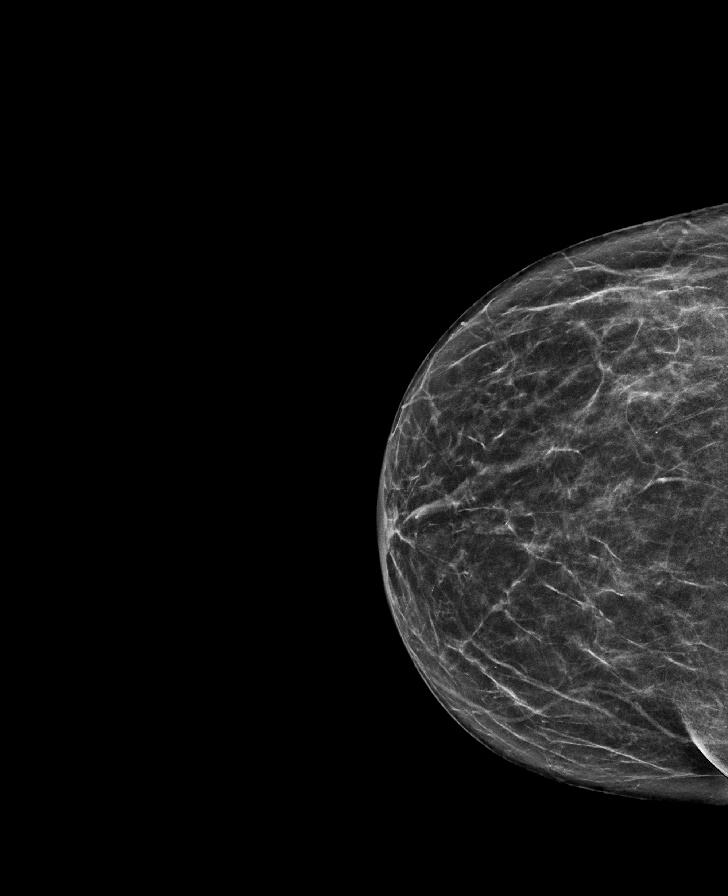

[L MLO synth-2D]
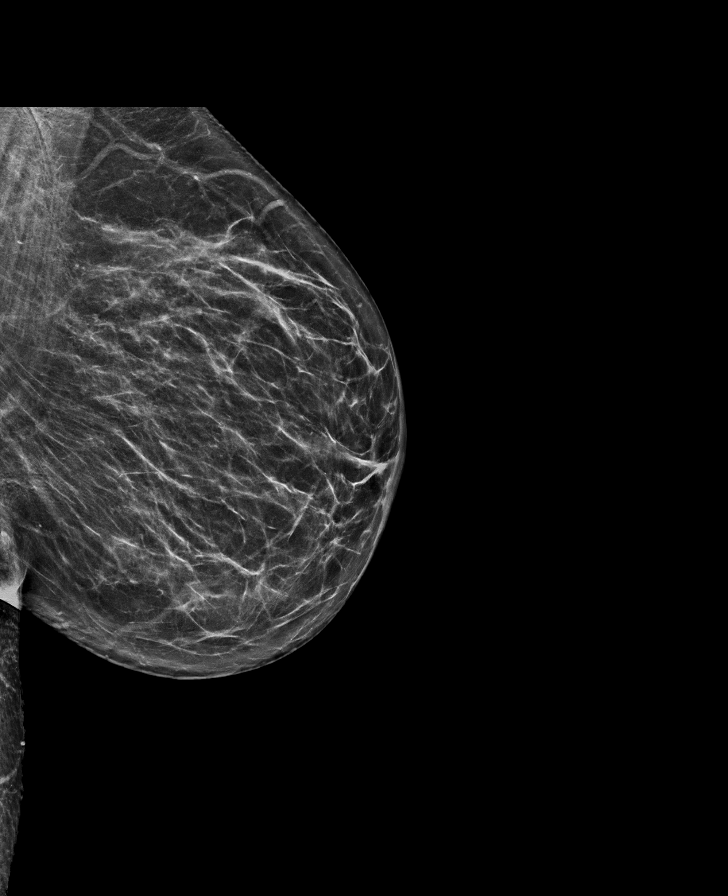

[R MLO synth-2D]
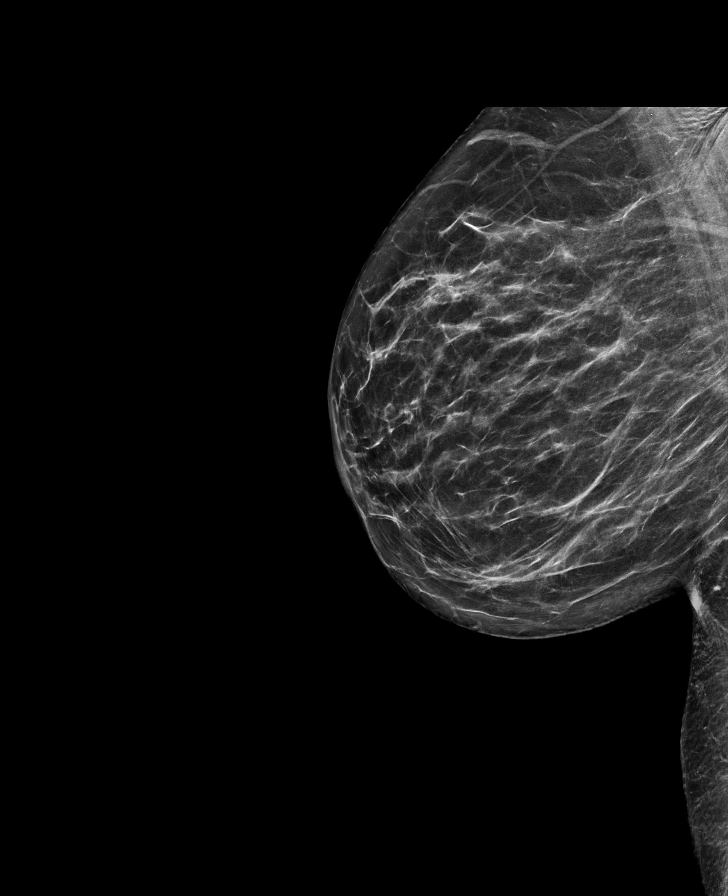

[L CC tomo · tomo slice 29/57.0]
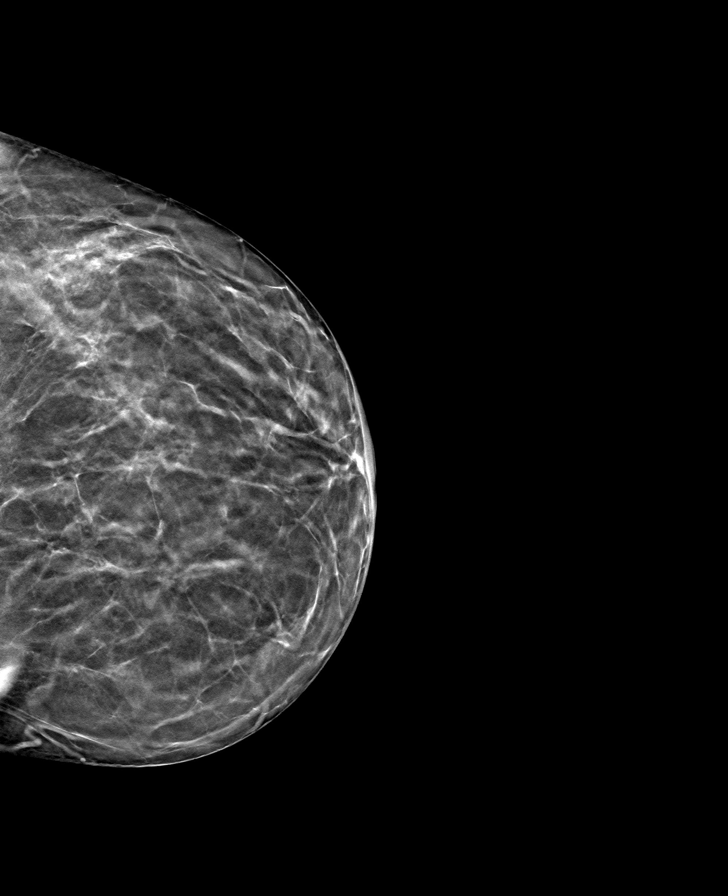

[R CC tomo · tomo slice 31/61.0]
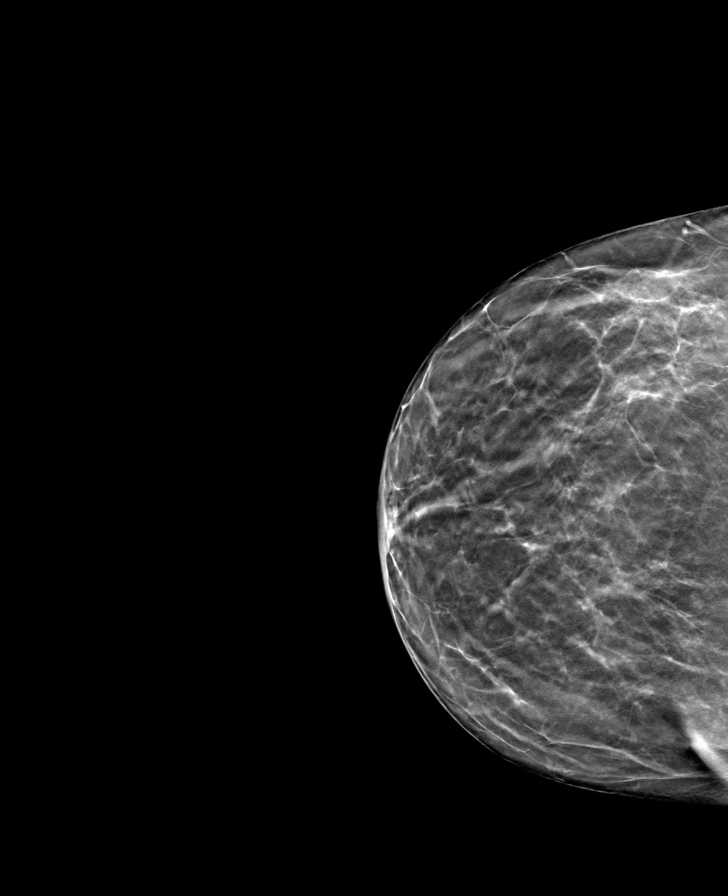

[R MLO tomo · tomo slice 33/66.0]
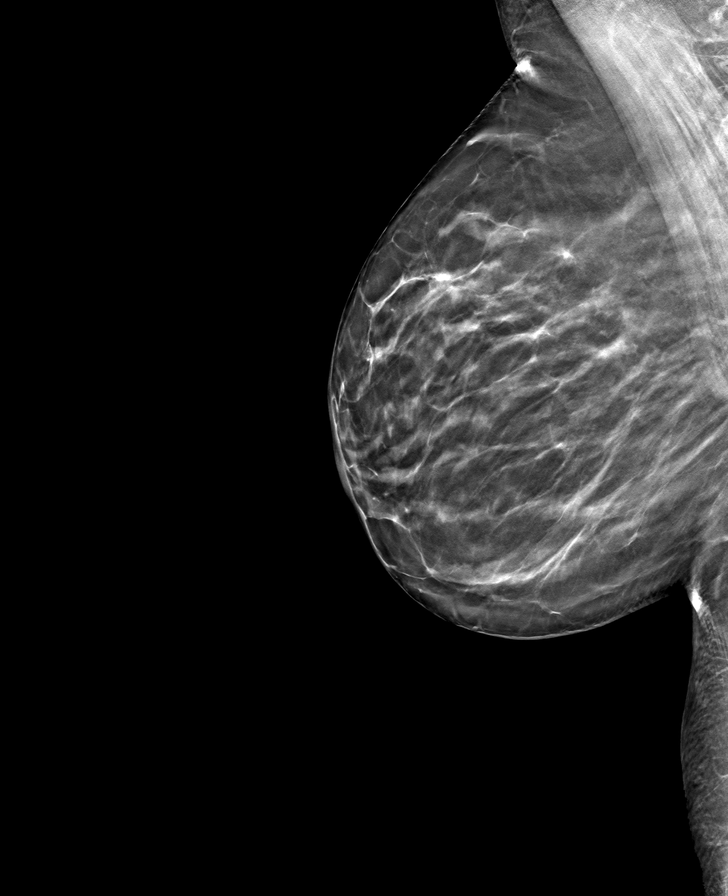

[L MLO tomo · tomo slice 34/67.0]
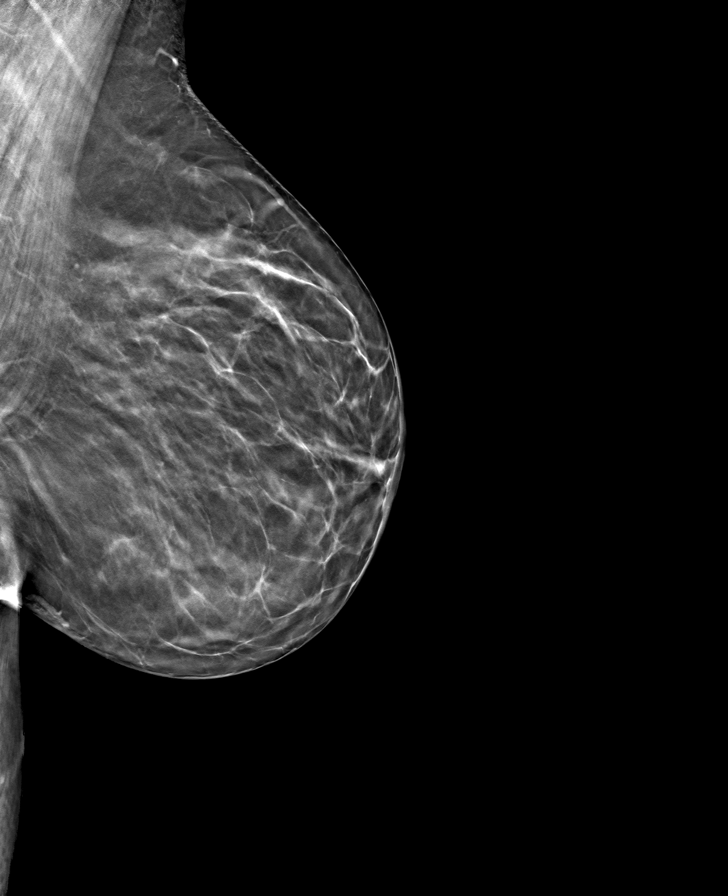

[8 of 24 positions shown; findings below may reference images not displayed]

ACR Breast Density Category b: There are scattered areas of
fibroglandular density.
FINDINGS: There are no findings suspicious for malignancy.
IMPRESSION: No mammographic evidence of malignancy. A result letter of this
screening mammogram will be mailed directly to the patient.

RECOMMENDATION:
Screening mammogram in one year. (Code:51-O-LD2)

BI-RADS CATEGORY  1: Negative.

## 2023-12-18 DIAGNOSIS — E1129 Type 2 diabetes mellitus with other diabetic kidney complication: Secondary | ICD-10-CM | POA: Diagnosis not present

## 2023-12-18 DIAGNOSIS — N183 Chronic kidney disease, stage 3 unspecified: Secondary | ICD-10-CM | POA: Diagnosis not present

## 2023-12-18 DIAGNOSIS — E782 Mixed hyperlipidemia: Secondary | ICD-10-CM | POA: Diagnosis not present

## 2023-12-18 DIAGNOSIS — E1169 Type 2 diabetes mellitus with other specified complication: Secondary | ICD-10-CM | POA: Diagnosis not present

## 2023-12-27 DIAGNOSIS — M533 Sacrococcygeal disorders, not elsewhere classified: Secondary | ICD-10-CM | POA: Diagnosis not present

## 2024-04-01 ENCOUNTER — Other Ambulatory Visit: Payer: Self-pay | Admitting: Family Medicine

## 2024-04-01 DIAGNOSIS — Z1231 Encounter for screening mammogram for malignant neoplasm of breast: Secondary | ICD-10-CM

## 2024-05-14 ENCOUNTER — Encounter (HOSPITAL_BASED_OUTPATIENT_CLINIC_OR_DEPARTMENT_OTHER): Payer: Self-pay

## 2024-05-14 ENCOUNTER — Ambulatory Visit (HOSPITAL_BASED_OUTPATIENT_CLINIC_OR_DEPARTMENT_OTHER): Admit: 2024-05-14 | Admitting: Otolaryngology

## 2024-05-14 SURGERY — MICROLARYNGOSCOPY
Anesthesia: General

## 2024-07-03 ENCOUNTER — Ambulatory Visit
